# Patient Record
Sex: Female | Born: 1980 | Race: Black or African American | Hispanic: No | Marital: Married | Smoking: Never smoker
Health system: Southern US, Community
[De-identification: ages and names within clinical notes are randomized; demographics above are authoritative.]

## PROBLEM LIST (undated history)

## (undated) DIAGNOSIS — M7989 Other specified soft tissue disorders: Secondary | ICD-10-CM

## (undated) DIAGNOSIS — Z86718 Personal history of other venous thrombosis and embolism: Secondary | ICD-10-CM

## (undated) DIAGNOSIS — I10 Essential (primary) hypertension: Secondary | ICD-10-CM

## (undated) DIAGNOSIS — R7303 Prediabetes: Secondary | ICD-10-CM

## (undated) DIAGNOSIS — E559 Vitamin D deficiency, unspecified: Secondary | ICD-10-CM

## (undated) DIAGNOSIS — M2550 Pain in unspecified joint: Secondary | ICD-10-CM

## (undated) HISTORY — DX: Other specified soft tissue disorders: M79.89

## (undated) HISTORY — DX: Prediabetes: R73.03

## (undated) HISTORY — DX: Vitamin D deficiency, unspecified: E55.9

## (undated) HISTORY — DX: Pain in unspecified joint: M25.50

## (undated) HISTORY — PX: DILATION AND CURETTAGE OF UTERUS: SHX78

## (undated) HISTORY — DX: Personal history of other venous thrombosis and embolism: Z86.718

---

## 2000-11-30 ENCOUNTER — Inpatient Hospital Stay (HOSPITAL_COMMUNITY): Admission: AD | Admit: 2000-11-30 | Discharge: 2000-11-30 | Payer: Self-pay | Admitting: *Deleted

## 2001-07-25 ENCOUNTER — Inpatient Hospital Stay (HOSPITAL_COMMUNITY): Admission: AD | Admit: 2001-07-25 | Discharge: 2001-07-25 | Payer: Self-pay | Admitting: *Deleted

## 2002-02-13 ENCOUNTER — Inpatient Hospital Stay (HOSPITAL_COMMUNITY): Admission: AD | Admit: 2002-02-13 | Discharge: 2002-02-13 | Payer: Self-pay | Admitting: *Deleted

## 2009-11-30 ENCOUNTER — Ambulatory Visit: Payer: Self-pay | Admitting: Radiology

## 2009-11-30 ENCOUNTER — Emergency Department (HOSPITAL_BASED_OUTPATIENT_CLINIC_OR_DEPARTMENT_OTHER): Admission: EM | Admit: 2009-11-30 | Discharge: 2009-11-30 | Payer: Self-pay | Admitting: Emergency Medicine

## 2010-11-25 ENCOUNTER — Other Ambulatory Visit (HOSPITAL_COMMUNITY)
Admission: RE | Admit: 2010-11-25 | Discharge: 2010-11-25 | Disposition: A | Discharge status: Home / Self Care | Payer: Managed Care, Other (non HMO) | Source: Ambulatory Visit | Attending: Obstetrics and Gynecology | Admitting: Obstetrics and Gynecology

## 2010-11-25 DIAGNOSIS — Z1159 Encounter for screening for other viral diseases: Secondary | ICD-10-CM | POA: Insufficient documentation

## 2010-11-25 DIAGNOSIS — Z113 Encounter for screening for infections with a predominantly sexual mode of transmission: Secondary | ICD-10-CM | POA: Insufficient documentation

## 2010-11-25 DIAGNOSIS — Z124 Encounter for screening for malignant neoplasm of cervix: Secondary | ICD-10-CM | POA: Insufficient documentation

## 2011-01-08 ENCOUNTER — Encounter: Payer: Self-pay | Admitting: *Deleted

## 2011-01-08 ENCOUNTER — Emergency Department (HOSPITAL_BASED_OUTPATIENT_CLINIC_OR_DEPARTMENT_OTHER)
Admission: EM | Admit: 2011-01-08 | Discharge: 2011-01-08 | Disposition: A | Discharge status: Home / Self Care | Payer: Managed Care, Other (non HMO) | Attending: Emergency Medicine | Admitting: Emergency Medicine

## 2011-01-08 DIAGNOSIS — S0511XA Contusion of eyeball and orbital tissues, right eye, initial encounter: Secondary | ICD-10-CM

## 2011-01-08 DIAGNOSIS — H1130 Conjunctival hemorrhage, unspecified eye: Secondary | ICD-10-CM | POA: Insufficient documentation

## 2011-01-08 DIAGNOSIS — I10 Essential (primary) hypertension: Secondary | ICD-10-CM | POA: Insufficient documentation

## 2011-01-08 DIAGNOSIS — S0510XA Contusion of eyeball and orbital tissues, unspecified eye, initial encounter: Secondary | ICD-10-CM | POA: Insufficient documentation

## 2011-01-08 HISTORY — DX: Essential (primary) hypertension: I10

## 2011-01-08 MED ORDER — HYDROCODONE-ACETAMINOPHEN 5-325 MG PO TABS: 2.0000 | ORAL_TABLET | ORAL | Status: AC | PRN

## 2011-01-08 MED ORDER — TETRACAINE HCL 0.5 % OP SOLN
OPHTHALMIC | Status: AC
  Filled 2011-01-08: qty 2

## 2011-01-08 MED ORDER — IBUPROFEN 800 MG PO TABS: 800.0000 mg | ORAL_TABLET | Freq: Three times a day (TID) | ORAL | Status: AC | PRN

## 2011-01-08 MED ORDER — FLUORESCEIN SODIUM 1 MG OP STRP
ORAL_STRIP | OPHTHALMIC | Status: AC
  Filled 2011-01-08: qty 1

## 2011-01-08 MED ORDER — TETRACAINE HCL 0.5 % OP SOLN: 2.0000 [drp] | Freq: Once | OPHTHALMIC | Status: DC

## 2011-01-08 MED ORDER — FLUORESCEIN SODIUM 1 MG OP STRP: 1.0000 | ORAL_STRIP | Freq: Once | OPHTHALMIC | Status: DC
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
## 2012-05-13 ENCOUNTER — Encounter (HOSPITAL_BASED_OUTPATIENT_CLINIC_OR_DEPARTMENT_OTHER): Payer: Self-pay | Admitting: *Deleted

## 2012-05-13 ENCOUNTER — Emergency Department (HOSPITAL_BASED_OUTPATIENT_CLINIC_OR_DEPARTMENT_OTHER)
Admission: EM | Admit: 2012-05-13 | Discharge: 2012-05-13 | Disposition: A | Discharge status: Home / Self Care | Payer: Managed Care, Other (non HMO) | Attending: Emergency Medicine | Admitting: Emergency Medicine

## 2012-05-13 DIAGNOSIS — Z79899 Other long term (current) drug therapy: Secondary | ICD-10-CM | POA: Insufficient documentation

## 2012-05-13 DIAGNOSIS — R51 Headache: Secondary | ICD-10-CM | POA: Insufficient documentation

## 2012-05-13 DIAGNOSIS — R0682 Tachypnea, not elsewhere classified: Secondary | ICD-10-CM | POA: Insufficient documentation

## 2012-05-13 DIAGNOSIS — R0989 Other specified symptoms and signs involving the circulatory and respiratory systems: Secondary | ICD-10-CM | POA: Insufficient documentation

## 2012-05-13 DIAGNOSIS — J3489 Other specified disorders of nose and nasal sinuses: Secondary | ICD-10-CM | POA: Insufficient documentation

## 2012-05-13 DIAGNOSIS — I10 Essential (primary) hypertension: Secondary | ICD-10-CM | POA: Insufficient documentation

## 2012-05-13 DIAGNOSIS — E669 Obesity, unspecified: Secondary | ICD-10-CM | POA: Insufficient documentation

## 2012-05-13 DIAGNOSIS — J069 Acute upper respiratory infection, unspecified: Secondary | ICD-10-CM

## 2012-05-13 DIAGNOSIS — R05 Cough: Secondary | ICD-10-CM | POA: Insufficient documentation

## 2012-05-13 DIAGNOSIS — R062 Wheezing: Secondary | ICD-10-CM | POA: Insufficient documentation

## 2012-05-13 DIAGNOSIS — J9801 Acute bronchospasm: Secondary | ICD-10-CM

## 2012-05-13 DIAGNOSIS — R6883 Chills (without fever): Secondary | ICD-10-CM | POA: Insufficient documentation

## 2012-05-13 DIAGNOSIS — R059 Cough, unspecified: Secondary | ICD-10-CM | POA: Insufficient documentation

## 2012-05-13 DIAGNOSIS — R0789 Other chest pain: Secondary | ICD-10-CM | POA: Insufficient documentation

## 2012-05-13 MED ORDER — ALBUTEROL SULFATE (5 MG/ML) 0.5% IN NEBU
5.0000 mg | INHALATION_SOLUTION | Freq: Once | RESPIRATORY_TRACT | Status: AC
  Administered 2012-05-13: 5 mg via RESPIRATORY_TRACT

## 2012-05-13 MED ORDER — ALBUTEROL SULFATE (5 MG/ML) 0.5% IN NEBU
INHALATION_SOLUTION | RESPIRATORY_TRACT | Status: AC
  Administered 2012-05-13: 5 mg via RESPIRATORY_TRACT
  Filled 2012-05-13: qty 1

## 2012-05-13 MED ORDER — ALBUTEROL SULFATE HFA 108 (90 BASE) MCG/ACT IN AERS
2.0000 | INHALATION_SPRAY | Freq: Once | RESPIRATORY_TRACT | Status: AC
  Administered 2012-05-13: 2 via RESPIRATORY_TRACT
  Filled 2012-05-13: qty 6.7

## 2012-05-13 MED ORDER — ALBUTEROL SULFATE (5 MG/ML) 0.5% IN NEBU
5.0000 mg | INHALATION_SOLUTION | Freq: Once | RESPIRATORY_TRACT | Status: AC
  Administered 2012-05-13: 5 mg via RESPIRATORY_TRACT
  Filled 2012-05-13: qty 1

## 2012-05-13 MED ORDER — PREDNISONE 50 MG PO TABS
60.0000 mg | ORAL_TABLET | Freq: Once | ORAL | Status: AC
  Administered 2012-05-13: 60 mg via ORAL
  Filled 2012-05-13: qty 1

## 2012-05-13 NOTE — ED Notes (Signed)
Headache and chest congestion x 3 days.

## 2012-05-13 NOTE — ED Provider Notes (Signed)
History     CSN: [id number]  Arrival date & time 05/13/12  1413   First MD Initiated Contact with Patient 05/13/12 1424      Chief Complaint  Patient presents with  . URI    (Consider location/radiation/quality/duration/timing/severity/associated sxs/prior treatment) HPI Comments: 32 year old obese female presents the emergency department complaining of cough and chest congestion x3 days. Patient states the cough is dry, however feels as if she has mucus in her chest. Admits to associated headache, sinus pressure and chills. Denies fever, nausea or vomiting. She and her husband were out working in the yard a few days back and believes the pollen making her allergies act up. She tried taking over-the-counter Tylenol cold at night which gave her relief from the cough. Her daily allergy pill is not providing any relief. Denies chest pain, just tightness. No SOB.  Patient is a 32 y.o. female presenting with URI. The history is provided by the patient.  URI Presenting symptoms: congestion and cough   Presenting symptoms: no fever and no sore throat   Associated symptoms: headaches and wheezing   Associated symptoms: no neck pain     Past Medical History  Diagnosis Date  . Hypertension     History reviewed. No pertinent past surgical history.  No family history on file.  History  Substance Use Topics  . Smoking status: Never Smoker   . Smokeless tobacco: Not on file  . Alcohol Use: No    OB History   Grav Para Term Preterm Abortions TAB SAB Ect Mult Living                  Review of Systems  Constitutional: Positive for chills. Negative for fever.  HENT: Positive for congestion and sinus pressure. Negative for sore throat, neck pain and neck stiffness.   Respiratory: Positive for cough, chest tightness and wheezing. Negative for shortness of breath.   Cardiovascular: Negative for chest pain.  Gastrointestinal: Negative for nausea and vomiting.  Neurological: Positive  for headaches. Negative for dizziness and light-headedness.  All other systems reviewed and are negative.    Allergies  Penicillins  Home Medications   Current Outpatient Rx  Name  Route  Sig  Dispense  Refill  . chlorthalidone (HYGROTON) 25 MG tablet   Oral   Take 25 mg by mouth daily.           Marland Kitchen losartan (COZAAR) 50 MG tablet   Oral   Take 50 mg by mouth daily.             BP 154/84  Pulse 82  Temp(Src) 98.4 F (36.9 C) (Oral)  Resp 22  Wt 330 lb (149.687 kg)  BMI 46.05 kg/m2  SpO2 99%  Physical Exam  Nursing note and vitals reviewed. Constitutional: She is oriented to person, place, and time. She appears well-developed. No distress.  Obese  HENT:  Head: Normocephalic and atraumatic.  Nose: No mucosal edema or rhinorrhea. Right sinus exhibits maxillary sinus tenderness. Left sinus exhibits maxillary sinus tenderness.  Mouth/Throat: Uvula is midline and mucous membranes are normal. Posterior oropharyngeal erythema present. No oropharyngeal exudate or posterior oropharyngeal edema.  Eyes: Conjunctivae are normal.  Neck: Normal range of motion. Neck supple.  Cardiovascular: Normal rate, regular rhythm, normal heart sounds and intact distal pulses.   Pulmonary/Chest: No accessory muscle usage. Tachypnea (mild) noted. No respiratory distress. She has no decreased breath sounds. She has wheezes (scattered expiratory). She has no rhonchi. She has no rales.  Musculoskeletal:  Normal range of motion. She exhibits no edema.  Lymphadenopathy:    She has no cervical adenopathy.  Neurological: She is alert and oriented to person, place, and time.  Skin: Skin is warm and dry.  Psychiatric: She has a normal mood and affect. Her behavior is normal.    ED Course  Procedures (including critical care time)  Labs Reviewed - No data to display No results found.   1. Bronchospasm   2. URI (upper respiratory infection)       MDM  32 y/o female with wheezing/cough. Will  give albuterol breathing treatment and re-assess. Vitals stable. Afebrile. She is in NAD. 3:03 PM Still with expiratory wheezing after first albuterol treatment. Will give 60 mg PO prednisone and another breathing treatment. 3:41 PM Marked clinical improvement noted with second albuterol treatment and PO prednisone. She is feeling much better. Will discharge with albuterol inhaler. Conservative measures discussed. Return precautions discussed. She will f/u with her PCP. Patient states understanding of plan and is agreeable.   Trevor Mace, PA-C 05/13/12 [phone]

## 2012-05-15 NOTE — ED Provider Notes (Signed)
Medical screening examination/treatment/procedure(s) were performed by non-physician practitioner and as supervising physician I was immediately available for consultation/collaboration.  Arlyn Bumpus T Zanyah Lentsch, MD 05/15/12 0829 

## 2013-11-05 ENCOUNTER — Emergency Department (HOSPITAL_BASED_OUTPATIENT_CLINIC_OR_DEPARTMENT_OTHER)
Admission: EM | Admit: 2013-11-05 | Discharge: 2013-11-05 | Disposition: A | Discharge status: Home / Self Care | Payer: Managed Care, Other (non HMO) | Attending: Emergency Medicine | Admitting: Emergency Medicine

## 2013-11-05 ENCOUNTER — Encounter (HOSPITAL_BASED_OUTPATIENT_CLINIC_OR_DEPARTMENT_OTHER): Payer: Self-pay | Admitting: Emergency Medicine

## 2013-11-05 DIAGNOSIS — R0602 Shortness of breath: Secondary | ICD-10-CM | POA: Diagnosis present

## 2013-11-05 DIAGNOSIS — Z880 Allergy status to penicillin: Secondary | ICD-10-CM | POA: Insufficient documentation

## 2013-11-05 DIAGNOSIS — J9801 Acute bronchospasm: Secondary | ICD-10-CM | POA: Diagnosis not present

## 2013-11-05 DIAGNOSIS — J069 Acute upper respiratory infection, unspecified: Secondary | ICD-10-CM | POA: Diagnosis not present

## 2013-11-05 DIAGNOSIS — Z79899 Other long term (current) drug therapy: Secondary | ICD-10-CM | POA: Insufficient documentation

## 2013-11-05 DIAGNOSIS — I10 Essential (primary) hypertension: Secondary | ICD-10-CM | POA: Diagnosis not present

## 2013-11-05 MED ORDER — ALBUTEROL SULFATE HFA 108 (90 BASE) MCG/ACT IN AERS: 1.0000 | INHALATION_SPRAY | Freq: Four times a day (QID) | RESPIRATORY_TRACT | Status: DC | PRN

## 2013-11-05 MED ORDER — AZITHROMYCIN 250 MG PO TABS: 250.0000 mg | ORAL_TABLET | Freq: Every day | ORAL | Status: DC

## 2013-11-05 NOTE — ED Notes (Signed)
Pt c/o URi symptoms x 1 week  

## 2013-11-05 NOTE — ED Provider Notes (Signed)
CSN: [id number]     Arrival date & time 11/05/13  1311 History   First MD Initiated Contact with Patient 11/05/13 1321     Chief Complaint  Patient presents with  . Shortness of Breath     (Consider location/radiation/quality/duration/timing/severity/associated sxs/prior Treatment) Patient is a 33 y.o. female presenting with shortness of breath. The history is provided by the patient. No language interpreter was used.  Shortness of Breath Severity:  Moderate Onset quality:  Gradual Duration:  1 week Timing:  Constant Associated symptoms: cough and sore throat   Associated symptoms: no abdominal pain, no fever, no headaches, no rash, no vomiting and no wheezing   Associated symptoms comment:  Symptoms started one week ago as sinus and nasal congestion with sore throat and have now moved into the chest with a persistent, sometimes productive cough. No known fever. No N, V. She has a history of similar symptoms yearly.   Past Medical History  Diagnosis Date  . Hypertension    History reviewed. No pertinent past surgical history. History reviewed. No pertinent family history. History  Substance Use Topics  . Smoking status: Never Smoker   . Smokeless tobacco: Not on file  . Alcohol Use: No   OB History   Grav Para Term Preterm Abortions TAB SAB Ect Mult Living                 Review of Systems  Constitutional: Negative for fever and appetite change.  HENT: Positive for congestion, sinus pressure and sore throat. Negative for trouble swallowing.   Eyes: Negative for discharge and redness.  Respiratory: Positive for cough. Negative for shortness of breath and wheezing.   Gastrointestinal: Negative for nausea, vomiting and abdominal pain.  Musculoskeletal: Negative for myalgias.  Skin: Negative for rash.  Neurological: Negative for weakness and headaches.      Allergies  Penicillins  Home Medications   Prior to Admission medications   Medication Sig Start Date End  Date Taking? Authorizing Provider  chlorthalidone (HYGROTON) 25 MG tablet Take 25 mg by mouth daily.      Historical Provider, MD  losartan (COZAAR) 50 MG tablet Take 50 mg by mouth daily.      Historical Provider, MD   BP 168/97  Pulse 88  Temp(Src) 98.2 F (36.8 C) (Oral)  Resp 20  Ht 5' 10.5" (1.791 m)  Wt 380 lb (172.367 kg)  BMI 53.74 kg/m2  SpO2 97%  LMP 08/06/2013 Physical Exam  Constitutional: She is oriented to person, place, and time. She appears well-developed and well-nourished. No distress.  HENT:  Head: Normocephalic.  Right Ear: External ear normal.  Left Ear: External ear normal.  Nose: Mucosal edema present. Right sinus exhibits no frontal sinus tenderness. Left sinus exhibits no frontal sinus tenderness.  Mouth/Throat: Oropharynx is clear and moist.  Eyes: Conjunctivae are normal.  Neck: Normal range of motion. Neck supple.  Cardiovascular: Normal rate and normal heart sounds.   No murmur heard. Pulmonary/Chest: Effort normal and breath sounds normal. She has no wheezes. She has no rales. She exhibits no tenderness.  Abdominal: Soft. Bowel sounds are normal. She exhibits no distension. There is no tenderness.  Musculoskeletal: Normal range of motion.  Lymphadenopathy:    She has no cervical adenopathy.  Neurological: She is alert and oriented to person, place, and time.  Skin: Skin is warm and dry. No rash noted.  Psychiatric: She has a normal mood and affect.    ED Course  Procedures (including critical care  time) Labs Review Labs Reviewed - No data to display  Imaging Review No results found.   EKG Interpretation None      MDM   Final diagnoses:  None    1. URI 2. Bronchospasm  Given duration of symptoms, now worsening, will treat with abx, inhaler, OTC supportive care and PCP follow up prn.    Arnoldo Hooker, PA-C 11/05/13 1331

## 2013-11-05 NOTE — Discharge Instructions (Signed)
Bronchospasm °A bronchospasm is a spasm or tightening of the airways going into the lungs. During a bronchospasm breathing becomes more difficult because the airways get smaller. When this happens there can be coughing, a whistling sound when breathing (wheezing), and difficulty breathing. Bronchospasm is often associated with asthma, but not all patients who experience a bronchospasm have asthma. °CAUSES  °A bronchospasm is caused by inflammation or irritation of the airways. The inflammation or irritation may be triggered by:  °· Allergies (such as to animals, pollen, food, or mold). Allergens that cause bronchospasm may cause wheezing immediately after exposure or many hours later.   °· Infection. Viral infections are believed to be the most common cause of bronchospasm.   °· Exercise.   °· Irritants (such as pollution, cigarette smoke, strong odors, aerosol sprays, and paint fumes).   °· Weather changes. Winds increase molds and pollens in the air. Rain refreshes the air by washing irritants out. Cold air may cause inflammation.   °· Stress and emotional upset.   °SIGNS AND SYMPTOMS  °· Wheezing.   °· Excessive nighttime coughing.   °· Frequent or severe coughing with a simple cold.   °· Chest tightness.   °· Shortness of breath.   °DIAGNOSIS  °Bronchospasm is usually diagnosed through a history and physical exam. Tests, such as chest X-rays, are sometimes done to look for other conditions. °TREATMENT  °· Inhaled medicines can be given to open up your airways and help you breathe. The medicines can be given using either an inhaler or a nebulizer machine. °· Corticosteroid medicines may be given for severe bronchospasm, usually when it is associated with asthma. °HOME CARE INSTRUCTIONS  °· Always have a plan prepared for seeking medical care. Know when to call your health care provider and local emergency services (911 in the [place] Know where you can access local emergency care. °· Only take medicines as  directed by your health care provider. °· If you were prescribed an inhaler or nebulizer machine, ask your health care provider to explain how to use it correctly. Always use a spacer with your inhaler if you were given one. °· It is necessary to remain calm during an attack. Try to relax and breathe more slowly.  °· Control your home environment in the following ways:   °¨ Change your heating and air conditioning filter at least once a month.   °¨ Limit your use of fireplaces and wood stoves. °¨ Do not smoke and do not allow smoking in your home.   °¨ Avoid exposure to perfumes and fragrances.   °¨ Get rid of pests (such as roaches and mice) and their droppings.   °¨ Throw away plants if you see mold on them.   °¨ Keep your house clean and dust free.   °¨ Replace carpet with wood, tile, or vinyl flooring. Carpet can trap dander and dust.   °¨ Use allergy-proof pillows, mattress covers, and box spring covers.   °¨ Wash bed sheets and blankets every week in hot water and dry them in a dryer.   °¨ Use blankets that are made of polyester or cotton.   °¨ Wash hands frequently. °SEEK MEDICAL CARE IF:  °· You have muscle aches.   °· You have chest pain.   °· The sputum changes from clear or white to yellow, green, gray, or bloody.   °· The sputum you cough up gets thicker.   °· There are problems that may be related to the medicine you are given, such as a rash, itching, swelling, or trouble breathing.   °SEEK IMMEDIATE MEDICAL CARE IF:  °· You have worsening wheezing and coughing even   after taking your prescribed medicines.   You have increased difficulty breathing.   You develop severe chest pain. MAKE SURE YOU:   Understand these instructions.  Will watch your condition.  Will get help right away if you are not doing well or get worse. Document Released: 01/19/2003 Document Revised: 01/21/2013 Document Reviewed: 07/08/2012 [facility] Patient Information 2015 [place], [place]. This information is not  intended to replace advice given to you by your health care provider. Make sure you discuss any questions you have with your health care provider. Upper Respiratory Infection, Adult An upper respiratory infection (URI) is also known as the common cold. It is often caused by a type of germ (virus). Colds are easily spread (contagious). You can pass it to others by kissing, coughing, sneezing, or drinking out of the same glass. Usually, you get better in 1 or 2 weeks.  HOME CARE   Only take medicine as told by your doctor.  Use a warm mist humidifier or breathe in steam from a hot shower.  Drink enough water and fluids to keep your pee (urine) clear or pale yellow.  Get plenty of rest.  Return to work when your temperature is back to normal or as told by your doctor. You may use a face mask and wash your hands to stop your cold from spreading. GET HELP RIGHT AWAY IF:   After the first few days, you feel you are getting worse.  You have questions about your medicine.  You have chills, shortness of breath, or brown or red spit (mucus).  You have yellow or brown snot (nasal discharge) or pain in the face, especially when you bend forward.  You have a fever, puffy (swollen) neck, pain when you swallow, or white spots in the back of your throat.  You have a bad headache, ear pain, sinus pain, or chest pain.  You have a high-pitched whistling sound when you breathe in and out (wheezing).  You have a lasting cough or cough up blood.  You have sore muscles or a stiff neck. MAKE SURE YOU:   Understand these instructions.  Will watch your condition.  Will get help right away if you are not doing well or get worse. Document Released: 07/05/2007 Document Revised: 04/10/2011 Document Reviewed: 04/23/2013 [facility] Patient Information 2015 [place], [place]. This information is not intended to replace advice given to you by your health care provider. Make sure you discuss any questions you have  with your health care provider.

## 2013-11-05 NOTE — ED Provider Notes (Signed)
Medical screening examination/treatment/procedure(s) were performed by non-physician practitioner and as supervising physician I was immediately available for consultation/collaboration.    Jerrald Doverspike, MLinwood Dibbles 11/05/13 [phone]

## 2014-04-23 ENCOUNTER — Encounter (HOSPITAL_BASED_OUTPATIENT_CLINIC_OR_DEPARTMENT_OTHER): Payer: Self-pay | Admitting: *Deleted

## 2014-04-23 ENCOUNTER — Emergency Department (HOSPITAL_BASED_OUTPATIENT_CLINIC_OR_DEPARTMENT_OTHER)
Admission: EM | Admit: 2014-04-23 | Discharge: 2014-04-23 | Discharge status: Against Medical Advice | Payer: Managed Care, Other (non HMO) | Attending: Emergency Medicine | Admitting: Emergency Medicine

## 2014-04-23 DIAGNOSIS — R42 Dizziness and giddiness: Secondary | ICD-10-CM | POA: Insufficient documentation

## 2014-04-23 DIAGNOSIS — R110 Nausea: Secondary | ICD-10-CM | POA: Insufficient documentation

## 2014-04-23 DIAGNOSIS — I10 Essential (primary) hypertension: Secondary | ICD-10-CM | POA: Insufficient documentation

## 2014-04-23 NOTE — ED Notes (Addendum)
Dizziness. States she has had cold sweats and nausea sudden onset for the past hour. States she started taking Phentermine 2 weeks ago and thinks that is why she is having these symptoms.

## 2014-04-23 NOTE — ED Notes (Signed)
Patient left.

## 2014-10-31 ENCOUNTER — Emergency Department (HOSPITAL_BASED_OUTPATIENT_CLINIC_OR_DEPARTMENT_OTHER)
Admission: EM | Admit: 2014-10-31 | Discharge: 2014-10-31 | Disposition: A | Discharge status: Home / Self Care | Payer: Managed Care, Other (non HMO) | Attending: Emergency Medicine | Admitting: Emergency Medicine

## 2014-10-31 ENCOUNTER — Encounter (HOSPITAL_BASED_OUTPATIENT_CLINIC_OR_DEPARTMENT_OTHER): Payer: Self-pay | Admitting: Emergency Medicine

## 2014-10-31 DIAGNOSIS — I10 Essential (primary) hypertension: Secondary | ICD-10-CM | POA: Insufficient documentation

## 2014-10-31 DIAGNOSIS — Z880 Allergy status to penicillin: Secondary | ICD-10-CM | POA: Insufficient documentation

## 2014-10-31 DIAGNOSIS — K122 Cellulitis and abscess of mouth: Secondary | ICD-10-CM

## 2014-10-31 DIAGNOSIS — Z79899 Other long term (current) drug therapy: Secondary | ICD-10-CM | POA: Insufficient documentation

## 2014-10-31 MED ORDER — DEXAMETHASONE SODIUM PHOSPHATE 10 MG/ML IJ SOLN
10.0000 mg | Freq: Once | INTRAMUSCULAR | Status: DC
  Filled 2014-10-31: qty 1

## 2014-10-31 MED ORDER — DEXAMETHASONE SODIUM PHOSPHATE 10 MG/ML IJ SOLN: 10.0000 mg | Freq: Once | INTRAMUSCULAR | Status: AC

## 2014-10-31 MED ORDER — DIPHENHYDRAMINE HCL 25 MG PO TABS: 25.0000 mg | ORAL_TABLET | Freq: Four times a day (QID) | ORAL | Status: DC

## 2014-10-31 MED ORDER — DIPHENHYDRAMINE HCL 12.5 MG/5ML PO ELIX
25.0000 mg | ORAL_SOLUTION | Freq: Once | ORAL | Status: AC
  Administered 2014-10-31: 25 mg via ORAL
  Filled 2014-10-31: qty 10

## 2014-10-31 MED ORDER — DEXAMETHASONE SODIUM PHOSPHATE 10 MG/ML IJ SOLN
10.0000 mg | Freq: Once | INTRAMUSCULAR | Status: AC
  Administered 2014-10-31: 10 mg via INTRAMUSCULAR

## 2014-10-31 NOTE — ED Notes (Signed)
Uvula appears swollen, no redness noted in throat noted at this time,

## 2014-10-31 NOTE — ED Provider Notes (Signed)
CSN: [id number]     Arrival date & time 10/31/14  1311 History   First MD Initiated Contact with Patient 10/31/14 1435     Chief Complaint  Patient presents with  . Sore Throat     (Consider location/radiation/quality/duration/timing/severity/associated sxs/prior Treatment) HPI Comments: Patient is 34 year old female with a history of hypertension and most likely obstructive sleep apnea as she states she does snore loudly when she sleeps who woke up this morning with leg cramps and took a spoonful of mustard. She then went back to sleep and when she will cut that 11:30 she had a sensation of something being in her throat. She was unable to take her blood pressure medication because she Gagging but has been able to drink liquids and tolerate her secretions. She denies any shortness of breath, rash or itching. She has had mustard before and never had a reaction to it. She says when she went back to sleep she did lay on her back which she normally sleeps on her side. No prior allergic reactions  Patient is a 34 y.o. female presenting with pharyngitis. The history is provided by the patient.  Sore Throat    Past Medical History  Diagnosis Date  . Hypertension    History reviewed. No pertinent past surgical history. History reviewed. No pertinent family history. Social History  Substance Use Topics  . Smoking status: Never Smoker   . Smokeless tobacco: None  . Alcohol Use: No   OB History    No data available     Review of Systems  All other systems reviewed and are negative.     Allergies  Penicillins  Home Medications   Prior to Admission medications   Medication Sig Start Date End Date Taking? Authorizing Provider  losartan (COZAAR) 50 MG tablet Take 50 mg by mouth daily.     Yes Historical Provider, MD  VALSARTAN PO Take by mouth.   Yes Historical Provider, MD  diphenhydrAMINE (BENADRYL) 25 MG tablet Take 1 tablet (25 mg total) by mouth every 6 (six) hours. 10/31/14    Gwyneth Sprout, MD   BP 149/84 mmHg  Pulse 72  Temp(Src) 98 F (36.7 C) (Oral)  Resp 18  SpO2 100% Physical Exam  Constitutional: She is oriented to person, place, and time. She appears well-developed and well-nourished. No distress.  HENT:  Head: Normocephalic and atraumatic.  Mouth/Throat: Oropharynx is clear and moist and mucous membranes are normal.    Eyes: Conjunctivae and EOM are normal. Pupils are equal, round, and reactive to light.  Neck: Normal range of motion. Neck supple.  Cardiovascular: Normal rate, regular rhythm and intact distal pulses.   No murmur heard. Pulmonary/Chest: Effort normal and breath sounds normal. No respiratory distress. She has no wheezes. She has no rales.  Musculoskeletal: Normal range of motion. She exhibits no edema or tenderness.  Neurological: She is alert and oriented to person, place, and time.  Skin: Skin is warm and dry. No rash noted. No erythema.  Psychiatric: She has a normal mood and affect. Her behavior is normal.  Nursing note and vitals reviewed.   ED Course  Procedures (including critical care time) Labs Review Labs Reviewed - No data to display  Imaging Review No results found. I have personally reviewed and evaluated these images and lab results as part of my medical decision-making.   EKG Interpretation None      MDM   Final diagnoses:  Uvulitis    Patient with uvulitis today which may be  due to a reaction to mustard versus an irritation from laying on her back and snoring. She woke up from sleep this morning around 11:30 with a globus sensation in her throat and swollen uvula. Here her uvula is still swollen but appears to be improving based on the picture she took at home. Patient is tolerating her own secretions but states was having difficulty swallowing her blood pressure pills. She is able to swallow liquids. Patient given Decadron and Benadryl. She lives close to the hospital and will return if symptoms  worsen.    Gwyneth Sprout, MD 10/31/14 [phone]

## 2014-10-31 NOTE — ED Notes (Signed)
Tracheal sounds clear, no dysphagia or dysphonia

## 2014-10-31 NOTE — ED Notes (Addendum)
Presents with sore throat, onset approx 11am, felt full, incident occurred after having some mustard

## 2014-10-31 NOTE — ED Notes (Signed)
Pt states she was having leg cramps this am, awoke and ate spoon full of mustard, went back to sleep and awoke around 1130 with sore throat

## 2015-08-24 DIAGNOSIS — Z0000 Encounter for general adult medical examination without abnormal findings: Secondary | ICD-10-CM | POA: Diagnosis not present

## 2015-08-24 DIAGNOSIS — H538 Other visual disturbances: Secondary | ICD-10-CM | POA: Diagnosis not present

## 2015-08-24 DIAGNOSIS — Z136 Encounter for screening for cardiovascular disorders: Secondary | ICD-10-CM | POA: Diagnosis not present

## 2015-08-24 DIAGNOSIS — R51 Headache: Secondary | ICD-10-CM | POA: Diagnosis not present

## 2015-08-24 DIAGNOSIS — R7303 Prediabetes: Secondary | ICD-10-CM | POA: Diagnosis not present

## 2015-08-24 DIAGNOSIS — Z01118 Encounter for examination of ears and hearing with other abnormal findings: Secondary | ICD-10-CM | POA: Diagnosis not present

## 2015-08-24 DIAGNOSIS — Z1383 Encounter for screening for respiratory disorder NEC: Secondary | ICD-10-CM | POA: Diagnosis not present

## 2015-08-24 DIAGNOSIS — I10 Essential (primary) hypertension: Secondary | ICD-10-CM | POA: Diagnosis not present

## 2015-08-24 DIAGNOSIS — I119 Hypertensive heart disease without heart failure: Secondary | ICD-10-CM | POA: Diagnosis not present

## 2015-08-24 DIAGNOSIS — Z131 Encounter for screening for diabetes mellitus: Secondary | ICD-10-CM | POA: Diagnosis not present

## 2015-09-10 DIAGNOSIS — I10 Essential (primary) hypertension: Secondary | ICD-10-CM | POA: Diagnosis not present

## 2015-09-10 DIAGNOSIS — I119 Hypertensive heart disease without heart failure: Secondary | ICD-10-CM | POA: Diagnosis not present

## 2015-09-10 DIAGNOSIS — K219 Gastro-esophageal reflux disease without esophagitis: Secondary | ICD-10-CM | POA: Diagnosis not present

## 2015-09-10 DIAGNOSIS — E559 Vitamin D deficiency, unspecified: Secondary | ICD-10-CM | POA: Diagnosis not present

## 2015-10-07 DIAGNOSIS — I119 Hypertensive heart disease without heart failure: Secondary | ICD-10-CM | POA: Diagnosis not present

## 2015-10-14 DIAGNOSIS — F4321 Adjustment disorder with depressed mood: Secondary | ICD-10-CM | POA: Diagnosis not present

## 2015-10-14 DIAGNOSIS — F322 Major depressive disorder, single episode, severe without psychotic features: Secondary | ICD-10-CM | POA: Diagnosis not present

## 2015-10-28 DIAGNOSIS — J069 Acute upper respiratory infection, unspecified: Secondary | ICD-10-CM | POA: Diagnosis not present

## 2015-10-28 DIAGNOSIS — I119 Hypertensive heart disease without heart failure: Secondary | ICD-10-CM | POA: Diagnosis not present

## 2015-10-28 DIAGNOSIS — K219 Gastro-esophageal reflux disease without esophagitis: Secondary | ICD-10-CM | POA: Diagnosis not present

## 2015-10-28 DIAGNOSIS — I10 Essential (primary) hypertension: Secondary | ICD-10-CM | POA: Diagnosis not present

## 2015-11-02 DIAGNOSIS — F4323 Adjustment disorder with mixed anxiety and depressed mood: Secondary | ICD-10-CM | POA: Diagnosis not present

## 2015-11-15 DIAGNOSIS — F4323 Adjustment disorder with mixed anxiety and depressed mood: Secondary | ICD-10-CM | POA: Diagnosis not present

## 2015-11-17 DIAGNOSIS — F410 Panic disorder [episodic paroxysmal anxiety] without agoraphobia: Secondary | ICD-10-CM | POA: Diagnosis not present

## 2015-11-17 DIAGNOSIS — F321 Major depressive disorder, single episode, moderate: Secondary | ICD-10-CM | POA: Diagnosis not present

## 2015-11-17 DIAGNOSIS — F430 Acute stress reaction: Secondary | ICD-10-CM | POA: Diagnosis not present

## 2016-01-06 DIAGNOSIS — F4311 Post-traumatic stress disorder, acute: Secondary | ICD-10-CM | POA: Diagnosis not present

## 2016-01-06 DIAGNOSIS — F410 Panic disorder [episodic paroxysmal anxiety] without agoraphobia: Secondary | ICD-10-CM | POA: Diagnosis not present

## 2016-02-11 DIAGNOSIS — K219 Gastro-esophageal reflux disease without esophagitis: Secondary | ICD-10-CM | POA: Diagnosis not present

## 2016-02-11 DIAGNOSIS — Z0000 Encounter for general adult medical examination without abnormal findings: Secondary | ICD-10-CM | POA: Diagnosis not present

## 2016-02-11 DIAGNOSIS — E559 Vitamin D deficiency, unspecified: Secondary | ICD-10-CM | POA: Diagnosis not present

## 2016-02-11 DIAGNOSIS — I10 Essential (primary) hypertension: Secondary | ICD-10-CM | POA: Diagnosis not present

## 2016-02-11 DIAGNOSIS — Z131 Encounter for screening for diabetes mellitus: Secondary | ICD-10-CM | POA: Diagnosis not present

## 2016-02-11 DIAGNOSIS — Z136 Encounter for screening for cardiovascular disorders: Secondary | ICD-10-CM | POA: Diagnosis not present

## 2016-02-11 DIAGNOSIS — R7303 Prediabetes: Secondary | ICD-10-CM | POA: Diagnosis not present

## 2016-02-11 DIAGNOSIS — I119 Hypertensive heart disease without heart failure: Secondary | ICD-10-CM | POA: Diagnosis not present

## 2016-02-11 DIAGNOSIS — Z01118 Encounter for examination of ears and hearing with other abnormal findings: Secondary | ICD-10-CM | POA: Diagnosis not present

## 2016-02-18 DIAGNOSIS — F410 Panic disorder [episodic paroxysmal anxiety] without agoraphobia: Secondary | ICD-10-CM | POA: Diagnosis not present

## 2016-02-18 DIAGNOSIS — F4311 Post-traumatic stress disorder, acute: Secondary | ICD-10-CM | POA: Diagnosis not present

## 2016-03-22 DIAGNOSIS — F4310 Post-traumatic stress disorder, unspecified: Secondary | ICD-10-CM | POA: Diagnosis not present

## 2016-03-28 DIAGNOSIS — E559 Vitamin D deficiency, unspecified: Secondary | ICD-10-CM | POA: Diagnosis not present

## 2016-03-28 DIAGNOSIS — I10 Essential (primary) hypertension: Secondary | ICD-10-CM | POA: Diagnosis not present

## 2016-03-28 DIAGNOSIS — I119 Hypertensive heart disease without heart failure: Secondary | ICD-10-CM | POA: Diagnosis not present

## 2016-03-28 DIAGNOSIS — K219 Gastro-esophageal reflux disease without esophagitis: Secondary | ICD-10-CM | POA: Diagnosis not present

## 2016-04-10 ENCOUNTER — Other Ambulatory Visit (HOSPITAL_COMMUNITY): Payer: Self-pay | Admitting: Surgery

## 2016-04-17 ENCOUNTER — Ambulatory Visit (HOSPITAL_COMMUNITY): Payer: BLUE CROSS/BLUE SHIELD

## 2016-04-17 ENCOUNTER — Other Ambulatory Visit (HOSPITAL_COMMUNITY): Payer: BLUE CROSS/BLUE SHIELD

## 2016-04-26 ENCOUNTER — Ambulatory Visit (HOSPITAL_COMMUNITY)
Admission: RE | Admit: 2016-04-26 | Discharge: 2016-04-26 | Disposition: A | Discharge status: Home / Self Care | Payer: BLUE CROSS/BLUE SHIELD | Source: Ambulatory Visit | Attending: Surgery | Admitting: Surgery

## 2016-04-26 DIAGNOSIS — Z01818 Encounter for other preprocedural examination: Secondary | ICD-10-CM | POA: Insufficient documentation

## 2016-04-26 DIAGNOSIS — R9431 Abnormal electrocardiogram [ECG] [EKG]: Secondary | ICD-10-CM | POA: Diagnosis not present

## 2016-04-26 DIAGNOSIS — Z01810 Encounter for preprocedural cardiovascular examination: Secondary | ICD-10-CM | POA: Diagnosis not present

## 2016-05-24 ENCOUNTER — Encounter: Payer: BLUE CROSS/BLUE SHIELD | Attending: Surgery | Admitting: Registered"

## 2016-05-24 ENCOUNTER — Encounter: Payer: Self-pay | Admitting: Registered"

## 2016-05-24 DIAGNOSIS — Z6841 Body Mass Index (BMI) 40.0 and over, adult: Secondary | ICD-10-CM | POA: Diagnosis not present

## 2016-05-24 DIAGNOSIS — Z713 Dietary counseling and surveillance: Secondary | ICD-10-CM | POA: Diagnosis not present

## 2016-05-24 DIAGNOSIS — E669 Obesity, unspecified: Secondary | ICD-10-CM

## 2016-05-24 NOTE — Progress Notes (Signed)
Pre-Op Assessment Visit:  Pre-Operative Sleeve Gastrectomy Surgery  Medical Nutrition Therapy:  Appt start time: 8:16  End time:  9:25  Patient was seen on 05/24/2016 for Pre-Operative Nutrition Assessment. Assessment and letter of approval faxed to [facility] Bariatric Surgery Program coordinator on 05/24/2016.   Pt expectation of surgery: develop a healthier lifestyle, to do more, decrease blood pressure pills  Pt expectation of Dietitian: meal planning, do's and don'ts  Start weight at [facility]: 402.4 BMI: 57.74   Pt states she wasn't eating healthy during weight loss programs. Pt reports she sometimes forgets to take her blood pressure pills. Pt states she wants to hear the truth when it comes to what her food habits/choices do to her body. Pt states she used to play basketball in high school and in college and looking forward to being able to be active again with her son.  Pt states she will contact [facility] or insurance company to verify the amount of visits needed prior to surgery and will contact us to schedule her next appt.    24 hr Dietary Recall: First Meal: normal skips, grits, sausage Snack: none Second Meal: broccoli & cheese soup, grilled chicken and mixed vegetables, chicken wings Snack: popcorn, chips, fruit Third Meal: burger, baked chicken, fried chicken, fries, seasoned rice, green beans, corn, broccoli Snack: none Beverages: coffee w/ sugar & creamer, water, tea  Encouraged to engage in 150 minutes of moderate physical activity including cardiovascular and weight baring weekly  Handouts given during visit include:  . Pre-Op Goals . Bariatric Surgery Protein Shakes During the appointment today the following Pre-Op Goals were reviewed with the patient: . Maintain or lose weight as instructed by your surgeon . Make healthy food choices . Begin to limit portion sizes . Limited concentrated sugars and fried foods . Keep fat/sugar in the single digits per  serving on          food labels . Practice CHEWING your food  (aim for 30 chews per bite or until applesauce consistency) . Practice not drinking 15 minutes before, during, and 30 minutes after each meal/snack . Avoid all carbonated beverages  . Avoid/limit caffeinated beverages  . Avoid all sugar-sweetened beverages . Consume 3 meals per day; eat every 3-5 hours . Make a list of non-food related activities . Aim for 64-100 ounces of FLUID daily  . Aim for at least 60-80 grams of PROTEIN daily . Look for a liquid protein source that contain ?15 g protein and ?5 g carbohydrate  (ex: shakes, drinks, shots)  -Follow diet recommendations listed below   Energy and Macronutrient Recomendations: Calories: 1800 Carbohydrate: 200 Protein: 135 Fat: 50  Demonstrated degree of understanding via:  Teach Back  Teaching Method Utilized:  Visual Auditory Hands on  Barriers to learning/adherence to lifestyle change: none identified   Patient to call the [facility] Education [facility] to enroll in Pre-Op and Post-Op Nutrition Education when surgery date is scheduled.

## 2016-05-24 NOTE — Patient Instructions (Signed)
-   Increase physical activity to 30 minutes at least 5 times a week

## 2016-07-14 ENCOUNTER — Ambulatory Visit: Payer: BLUE CROSS/BLUE SHIELD | Admitting: Registered"

## 2016-07-21 ENCOUNTER — Encounter: Payer: Self-pay | Admitting: Registered"

## 2016-07-21 ENCOUNTER — Encounter: Payer: BLUE CROSS/BLUE SHIELD | Attending: Surgery | Admitting: Registered"

## 2016-07-21 DIAGNOSIS — Z713 Dietary counseling and surveillance: Secondary | ICD-10-CM | POA: Insufficient documentation

## 2016-07-21 DIAGNOSIS — Z6841 Body Mass Index (BMI) 40.0 and over, adult: Secondary | ICD-10-CM | POA: Diagnosis not present

## 2016-07-21 DIAGNOSIS — E669 Obesity, unspecified: Secondary | ICD-10-CM

## 2016-07-21 NOTE — Progress Notes (Signed)
Appt start time: 8:30 end time: 9:00  Assessment: 1st SWL Appointment.   Start Wt at [facility]: 402.4 Wt: 401.8 BMI: 57.65   Pt arrives having 0.6 lbs from previous visit. Pt states she has not been drinking sodas; has tried Twist. Pt states she works Mon-Thurs 10-9pm. Pt has eliminated rice and pasta; does not eat bread.  Pt states she needs 6 visits altogether and would like them scheduled twice/month if possible.  Works    MEDICATIONS: See list   DIETARY INTAKE:  24-hr recall:  B ( AM): skipping   Snk ( AM): none  L ( PM): grilled chicken, cheese, vegetables Snk ( PM): ice cream D ( PM): baked chicken or steak, vegetables Snk ( PM): none Beverages: water, tea (half unsweet and half sweet)  Usual physical activity: none  Diet to Follow: Calories: 1800 kcals Carbohydrate: 200g Protein: 135g Fat: 50g  Preferred Learning Style:   No preference indicated   Learning Readiness:   Ready  Change in progress     Nutritional Diagnosis:  [place]-3.3 Overweight/obesity related to past poor dietary habits and physical inactivity as evidenced by patient w/ planned sleeve gastrectomy surgery following dietary guidelines for continued weight loss.    Intervention:  Nutrition counseling for upcoming Bariatric Surgery.  Goals:  - Add in breakfast. Find protein shake flavor that you enjoy that has at least 15g or more of protein and 5g or less of carbohydrates. - Increase physical activity to 30 min, 3 times per week at [employer]  Teaching Method Utilized:  Visual Auditory Hands on  Handouts given during visit include:  Protein Shakes  Barriers to learning/adherence to lifestyle change: none  Demonstrated degree of understanding via:  Teach Back   Monitoring/Evaluation:  Dietary intake, exercise, and body weight in 21 day(s).

## 2016-07-21 NOTE — Patient Instructions (Addendum)
-   Add in breakfast. Find protein shake flavor that you enjoy that has at least 15g or more of protein and 5g or less of carbohydrates.  - Increase physical activity to 30 min, 3 times per week at [employer]

## 2016-08-18 ENCOUNTER — Encounter: Payer: BLUE CROSS/BLUE SHIELD | Attending: Surgery | Admitting: Registered"

## 2016-08-18 ENCOUNTER — Encounter: Payer: Self-pay | Admitting: Registered"

## 2016-08-18 DIAGNOSIS — E669 Obesity, unspecified: Secondary | ICD-10-CM

## 2016-08-18 DIAGNOSIS — Z713 Dietary counseling and surveillance: Secondary | ICD-10-CM | POA: Insufficient documentation

## 2016-08-18 DIAGNOSIS — Z6841 Body Mass Index (BMI) 40.0 and over, adult: Secondary | ICD-10-CM | POA: Insufficient documentation

## 2016-08-18 NOTE — Patient Instructions (Addendum)
-   Add in one more day of physical activity to have 5 days of 30 min/day.   - Try Nature's twist and also MIO drops as option instead of juice.   - Monitor and aim for at least 64 oz fluid a day.

## 2016-08-18 NOTE — Progress Notes (Signed)
Appt start time: 9:10 end time: 9:27  Assessment: 2nd SWL Appointment.   Start Wt at [facility]: 402.4 Wt: 398.4 BMI: 57.16    Has been reducing portions Walking more than normal [place] [employer] [place] Loves the [employer] Premier Protein shake and choclolate Drinks juice about 3x/week Was burnt out on flavor packs and went to juices Has cut back on sugar amounts  Pt arrives having lost 3.4 lbs from previous visit. Pt states she has been reducing portion sizes as well as walking more than normal. Pt states she likes the chocolate Premier Protein but loves the caramel flavor. Pt reports she will try the clear option as well. Pt states she drinks juice about 3x/week and working on reducing that amount. Pt states she was burned out on flavor packs added to water and began drinking more juice. Pt states she has cut back on sugar consumption. Pt states she is going [place] [employer] [place] with family and friends.   Pt states she works Mon-Thurs 10-9pm; will change in [place] M-F 9am-6pm. Pt has eliminated rice and pasta; does not eat bread.  Pt states she needs 6 visits altogether and would like them scheduled twice/month if possible.   MEDICATIONS: See list   DIETARY INTAKE:  24-hr recall:  B ( AM): Protein shake   Snk ( AM): none  L ( PM): sandwich or hot dog  Snk ( PM): popcorn or fruit or vegetables D ( PM): rice, baked chicken, cake or ice cream Snk ( PM): sometimes caramel protein shake Beverages: water, tea (half unsweet and half sweet), juice, some soda  Usual physical activity: walking at least 30 min, 4x/day  Diet to Follow: Calories: 1800 kcals Carbohydrate: 200g Protein: 135g Fat: 50g  Preferred Learning Style:   No preference indicated   Learning Readiness:   Ready  Change in progress     Nutritional Diagnosis:  [place]-3.3 Overweight/obesity related to past poor dietary habits and physical inactivity as evidenced by patient w/ planned sleeve gastrectomy  surgery following dietary guidelines for continued weight loss.    Intervention:  Nutrition counseling for upcoming Bariatric Surgery.  Goals:  - Add in one more day of physical activity to have 5 days of 30 min/day.  - Try Nature's twist and also MIO drops as option instead of juice.  - Monitor and aim for at least 64 oz fluid a day.   Teaching Method Utilized:  Visual Auditory Hands on  Handouts given during visit include:  none  Barriers to learning/adherence to lifestyle change: none  Demonstrated degree of understanding via:  Teach Back   Monitoring/Evaluation:  Dietary intake, exercise, and body weight in 21 day(s).

## 2016-09-11 ENCOUNTER — Encounter: Payer: Self-pay | Admitting: Registered"

## 2016-09-11 ENCOUNTER — Encounter: Payer: BLUE CROSS/BLUE SHIELD | Attending: Surgery | Admitting: Registered"

## 2016-09-11 DIAGNOSIS — Z6841 Body Mass Index (BMI) 40.0 and over, adult: Secondary | ICD-10-CM | POA: Insufficient documentation

## 2016-09-11 DIAGNOSIS — Z713 Dietary counseling and surveillance: Secondary | ICD-10-CM | POA: Insufficient documentation

## 2016-09-11 DIAGNOSIS — E669 Obesity, unspecified: Secondary | ICD-10-CM

## 2016-09-11 NOTE — Progress Notes (Signed)
Appt start time: 8:45 end time: 9:08  Assessment: 3rd SWL Appointment.   Start Wt at [facility]: 402.4 Wt: 398.4 BMI: 57.16   Pt arrives 15 mi late. Pt has maintained weight from previous visit. Pt states she has tried to increase physical activity to 5x/week and still working on it. Pt states she has tried Twist and its ok, enjoys Minute Maid Light Fruit Punch better. Pt states she will begin seeing personal trainer this Friday for strength training. Pt sates she is drinking at least 64 oz per day and drinking more water especially while at work. Pt states she walks around the building at least once a day while at work. Pt reports she loves popcorn.  Pt states she works Mon-Thurs 10-9pm; will change in Sept 4 M-F 9am-6pm. Pt has eliminated rice and pasta; does not eat bread.  Pt states she needs 6 visits altogether and would like them scheduled twice/month if possible.   MEDICATIONS: See list   DIETARY INTAKE:  24-hr recall:  B ( AM): Protein shake   Snk ( AM): sometimes candy or gum L ( PM): salad or sandwich  Snk ( PM): popcorn or fruit or vegetables D ( PM): rice, baked chicken, non-starchy vegetables  Snk ( PM): sometimes popcorn, nabs Beverages: water, Minute Maid light fruit punch, tea (half unsweet and half sweet), flavor packs some soda  Usual physical activity: walking at least 30 min, 4x/day  Diet to Follow: Calories: 1800 kcals Carbohydrate: 200g Protein: 135g Fat: 50g  Preferred Learning Style:   No preference indicated   Learning Readiness:   Ready  Change in progress     Nutritional Diagnosis:  [place]-3.3 Overweight/obesity related to past poor dietary habits and physical inactivity as evidenced by patient w/ planned sleeve gastrectomy surgery following dietary guidelines for continued weight loss.    Intervention:  Nutrition counseling for upcoming Bariatric Surgery.  Goals:  - Practice not drinking 15 minutes before, during, and 30 minutes after each  meal/snack.  - Begin taking Multivitamin Complete and Vitamin D supplement. - Continue to aim for physical activity at least 5 times/week.  Teaching Method Utilized:  Visual Auditory Hands on  Handouts given during visit include:  Vitamin and Mineral Recommendations  Barriers to learning/adherence to lifestyle change: none  Demonstrated degree of understanding via:  Teach Back   Monitoring/Evaluation:  Dietary intake, exercise, and body weight in 21 day(s).

## 2016-09-11 NOTE — Patient Instructions (Addendum)
-   Practice not drinking 15 minutes before, during, and 30 minutes after each meal/snack.   - Begin taking Multivitamin Complete and Vitamin D supplement.  - Continue to aim for physical activity at least 5 times/week.

## 2016-10-10 DIAGNOSIS — Z01419 Encounter for gynecological examination (general) (routine) without abnormal findings: Secondary | ICD-10-CM | POA: Diagnosis not present

## 2016-10-10 DIAGNOSIS — N926 Irregular menstruation, unspecified: Secondary | ICD-10-CM | POA: Diagnosis not present

## 2016-10-10 DIAGNOSIS — R938 Abnormal findings on diagnostic imaging of other specified body structures: Secondary | ICD-10-CM | POA: Diagnosis not present

## 2016-10-12 ENCOUNTER — Ambulatory Visit: Payer: BLUE CROSS/BLUE SHIELD | Admitting: Skilled Nursing Facility1

## 2016-10-13 ENCOUNTER — Ambulatory Visit: Payer: BLUE CROSS/BLUE SHIELD | Admitting: Registered"

## 2016-10-19 DIAGNOSIS — R938 Abnormal findings on diagnostic imaging of other specified body structures: Secondary | ICD-10-CM | POA: Diagnosis not present

## 2016-10-19 DIAGNOSIS — N8502 Endometrial intraepithelial neoplasia [EIN]: Secondary | ICD-10-CM | POA: Diagnosis not present

## 2016-10-19 DIAGNOSIS — Z01812 Encounter for preprocedural laboratory examination: Secondary | ICD-10-CM | POA: Diagnosis not present

## 2016-10-19 DIAGNOSIS — N921 Excessive and frequent menstruation with irregular cycle: Secondary | ICD-10-CM | POA: Diagnosis not present

## 2016-10-23 ENCOUNTER — Encounter: Payer: BLUE CROSS/BLUE SHIELD | Attending: Surgery | Admitting: Registered"

## 2016-10-23 ENCOUNTER — Encounter: Payer: Self-pay | Admitting: Registered"

## 2016-10-23 DIAGNOSIS — Z713 Dietary counseling and surveillance: Secondary | ICD-10-CM | POA: Insufficient documentation

## 2016-10-23 DIAGNOSIS — Z6841 Body Mass Index (BMI) 40.0 and over, adult: Secondary | ICD-10-CM | POA: Diagnosis not present

## 2016-10-23 DIAGNOSIS — E669 Obesity, unspecified: Secondary | ICD-10-CM

## 2016-10-23 NOTE — Progress Notes (Signed)
Appt start time: 9:30 end time: 9:50  Assessment: 4th SWL Appointment.   Start Wt at [facility]: 402.4 Wt: 393.1  BMI: 56.40   Pt calls and arrives 28 min late from scheduled appt time. Pt has lost 5 lbs from previous visit. Pt states her doctor recently noticed there is something blocking her left ovary and currently on medication and having tests done to investigate. Pt states she had a biopsy last Wed and will go over results this upcoming Wed 9/26. Pt states she has increased physical activity to 4-5x/week. Pt states when she does not go to gym, she walks at work. 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 Pt states she is taking Centrum gummy MVI and Vitamin D as well. Pt states she drinks coffee 1-2 times a week. Pt reports coffee is mixed with decaf at times, along with protein shake. Pt states she is going to [facility] for a few days to celebrate upcoming birthday. Pt states she is not rushing to have surgery and wants to see what is happening with ovary first.   Pt states she works M-F 9am-6pm. Pt has eliminated rice and pasta; does not eat bread (except once a week with burger).   Pt states she needs 6 visits altogether.    MEDICATIONS: See list   DIETARY INTAKE:  24-hr recall:  B ( AM): Protein shake or scrambled egg and cheese, 2 [place] sausage links   Snk ( AM): fruit  L ( PM): grilled chicken, cheese with sauteed vegetables or burger without bread Snk ( PM): popcorn or cheese doodles D ( PM): baked tilapia, sauteed non-starchy vegetables  Snk ( PM): sometimes popcorn, nabs Beverages: water, Minute Maid light fruit punch, tea (half unsweet and half sweet), flavor packs some soda, coffee w/ caramel/cookies and cream protein shake  Usual physical activity: Gym-elliptical, biking, treadmill, stair climbing; walking on non-gym days at least 30 min, 4-5x/day  Diet to Follow: Calories: 1800 kcals Carbohydrate: 200g Protein: 135g Fat: 50g  Preferred Learning Style:    No preference indicated   Learning Readiness:   Ready  Change in progress     Nutritional Diagnosis:  [place]-3.3 Overweight/obesity related to past poor dietary habits and physical inactivity as evidenced by patient w/ planned sleeve gastrectomy surgery following dietary guidelines for continued weight loss.    Intervention:  Nutrition counseling for upcoming Bariatric Surgery.  Goals:  - Continue to aim to wait 30 min after eating. Use Baritastic App to set timers. - Aim to chew at least 30 times per bite or to applesauce consistency.   Teaching Method Utilized:  Visual Auditory Hands on  Handouts given during visit include:  Vitamin and Mineral Recommendations  Barriers to learning/adherence to lifestyle change: none  Demonstrated degree of understanding via:  Teach Back   Monitoring/Evaluation:  Dietary intake, exercise, and body weight in 21 day(s).

## 2016-10-23 NOTE — Patient Instructions (Addendum)
-   Continue to aim to wait 30 min after eating. Use Baritastic App to set timers.  - Aim to chew at least 30 times per bite or to applesauce consistency.

## 2016-10-25 DIAGNOSIS — N8502 Endometrial intraepithelial neoplasia [EIN]: Secondary | ICD-10-CM | POA: Diagnosis not present

## 2016-11-02 DIAGNOSIS — N8502 Endometrial intraepithelial neoplasia [EIN]: Secondary | ICD-10-CM | POA: Diagnosis not present

## 2016-11-02 DIAGNOSIS — N8501 Benign endometrial hyperplasia: Secondary | ICD-10-CM | POA: Diagnosis not present

## 2016-11-22 ENCOUNTER — Encounter: Payer: BLUE CROSS/BLUE SHIELD | Attending: Surgery | Admitting: Registered"

## 2016-11-22 ENCOUNTER — Encounter: Payer: Self-pay | Admitting: Registered"

## 2016-11-22 DIAGNOSIS — Z713 Dietary counseling and surveillance: Secondary | ICD-10-CM | POA: Insufficient documentation

## 2016-11-22 DIAGNOSIS — Z6841 Body Mass Index (BMI) 40.0 and over, adult: Secondary | ICD-10-CM | POA: Diagnosis not present

## 2016-11-22 DIAGNOSIS — E669 Obesity, unspecified: Secondary | ICD-10-CM

## 2016-11-22 NOTE — Patient Instructions (Addendum)
-   Get back into physical activity routine after Fri, 10/26: gym and walking on non-gym days. At least 30 min a day, 5 days a week.   - Keep up the great work!

## 2016-11-22 NOTE — Progress Notes (Signed)
Appt start time: 8:02 end time: 8:18  Assessment: 5th SWL Appointment.   Start Wt at [facility]: 402.4 Wt: 393.7  BMI: 56.49   Pt arrives having maintained weight from previous visit. 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 Pt reports she will have a procedure this Fri to assess status of ovary. Pt states she has become anemic since the last visit; now waking iron pills as well. Pt states she has been chewing ice a lot lately.   Pt states she has been waiting 30 min after eating by setting a stop watch on her cellphone. Pt states she is also doing well with chewing. Pt states she drinks decaf coffee at times on cold days. Pt states she has not been able to work out lately, but plans to return to her routine after she has her procedure on Fri, 10/26.   Pt states she works M-F 9am-6pm. Pt has eliminated rice and pasta; does not eat bread (except once a week with burger).   Pt states she needs 6 visits altogether.    MEDICATIONS: See list   DIETARY INTAKE:  24-hr recall:  B ( AM): Protein shake or scrambled egg and cheese, 2 [place] sausage links   Snk ( AM): fruit  L ( PM): grilled chicken, cheese with sauteed vegetables or burger without bread Snk ( PM): popcorn or cheese doodles D ( PM): baked tilapia, sauteed non-starchy vegetables  Snk ( PM): sometimes popcorn, nabs Beverages: water, Minute Maid light fruit punch, tea (half unsweet and half sweet), flavor packs some soda, decaf coffee w/ caramel/cookies and cream protein shake  Usual physical activity: Gym-elliptical, biking, treadmill, stair climbing; walking on non-gym days at least 30 min, 4-5x/day; unable to exercise recently  Diet to Follow: Calories: 1800 kcals Carbohydrate: 200g Protein: 135g Fat: 50g  Preferred Learning Style:   No preference indicated   Learning Readiness:   Ready  Change in progress     Nutritional Diagnosis:   [place]-3.3 Overweight/obesity related to past poor dietary habits and physical inactivity as evidenced by patient w/ planned sleeve gastrectomy surgery following dietary guidelines for continued weight loss.    Intervention:  Nutrition counseling for upcoming Bariatric Surgery.  Goals:  - Get back into physical activity routine after Fri, 10/26: gym and walking on non-gym days. At least 30 min a day, 5 days a week.  - Keep up the great work!  Teaching Method Utilized:  Visual Auditory Hands on  Handouts given during visit include:  none  Barriers to learning/adherence to lifestyle change: none  Demonstrated degree of understanding via:  Teach Back   Monitoring/Evaluation:  Dietary intake, exercise, and body weight in 1 month(s).

## 2016-11-23 DIAGNOSIS — Z79890 Hormone replacement therapy (postmenopausal): Secondary | ICD-10-CM | POA: Diagnosis not present

## 2016-11-23 DIAGNOSIS — D509 Iron deficiency anemia, unspecified: Secondary | ICD-10-CM | POA: Diagnosis not present

## 2016-11-23 DIAGNOSIS — Z880 Allergy status to penicillin: Secondary | ICD-10-CM | POA: Diagnosis not present

## 2016-11-23 DIAGNOSIS — N939 Abnormal uterine and vaginal bleeding, unspecified: Secondary | ICD-10-CM | POA: Diagnosis not present

## 2016-11-23 DIAGNOSIS — Z79899 Other long term (current) drug therapy: Secondary | ICD-10-CM | POA: Diagnosis not present

## 2016-11-23 DIAGNOSIS — I10 Essential (primary) hypertension: Secondary | ICD-10-CM | POA: Diagnosis not present

## 2016-11-23 DIAGNOSIS — Z791 Long term (current) use of non-steroidal anti-inflammatories (NSAID): Secondary | ICD-10-CM | POA: Diagnosis not present

## 2016-11-23 DIAGNOSIS — C541 Malignant neoplasm of endometrium: Secondary | ICD-10-CM | POA: Diagnosis not present

## 2016-11-23 DIAGNOSIS — Z6841 Body Mass Index (BMI) 40.0 and over, adult: Secondary | ICD-10-CM | POA: Diagnosis not present

## 2016-11-24 DIAGNOSIS — N840 Polyp of corpus uteri: Secondary | ICD-10-CM | POA: Diagnosis not present

## 2016-11-24 DIAGNOSIS — C541 Malignant neoplasm of endometrium: Secondary | ICD-10-CM | POA: Diagnosis not present

## 2016-11-24 DIAGNOSIS — N939 Abnormal uterine and vaginal bleeding, unspecified: Secondary | ICD-10-CM | POA: Diagnosis not present

## 2016-11-24 DIAGNOSIS — Z6841 Body Mass Index (BMI) 40.0 and over, adult: Secondary | ICD-10-CM | POA: Diagnosis not present

## 2016-11-24 DIAGNOSIS — Z880 Allergy status to penicillin: Secondary | ICD-10-CM | POA: Diagnosis not present

## 2016-11-24 DIAGNOSIS — Z791 Long term (current) use of non-steroidal anti-inflammatories (NSAID): Secondary | ICD-10-CM | POA: Diagnosis not present

## 2016-11-24 DIAGNOSIS — D509 Iron deficiency anemia, unspecified: Secondary | ICD-10-CM | POA: Diagnosis not present

## 2016-11-24 DIAGNOSIS — Z79899 Other long term (current) drug therapy: Secondary | ICD-10-CM | POA: Diagnosis not present

## 2016-11-24 DIAGNOSIS — N8502 Endometrial intraepithelial neoplasia [EIN]: Secondary | ICD-10-CM | POA: Diagnosis not present

## 2016-11-24 DIAGNOSIS — I10 Essential (primary) hypertension: Secondary | ICD-10-CM | POA: Diagnosis not present

## 2016-11-24 DIAGNOSIS — Z79890 Hormone replacement therapy (postmenopausal): Secondary | ICD-10-CM | POA: Diagnosis not present

## 2016-12-18 ENCOUNTER — Ambulatory Visit: Payer: BLUE CROSS/BLUE SHIELD | Admitting: Registered"

## 2016-12-26 ENCOUNTER — Encounter: Payer: BLUE CROSS/BLUE SHIELD | Attending: Surgery | Admitting: Skilled Nursing Facility1

## 2016-12-26 ENCOUNTER — Encounter: Payer: Self-pay | Admitting: Skilled Nursing Facility1

## 2016-12-26 DIAGNOSIS — Z713 Dietary counseling and surveillance: Secondary | ICD-10-CM | POA: Diagnosis not present

## 2016-12-26 DIAGNOSIS — E669 Obesity, unspecified: Secondary | ICD-10-CM

## 2016-12-26 DIAGNOSIS — Z6841 Body Mass Index (BMI) 40.0 and over, adult: Secondary | ICD-10-CM | POA: Diagnosis not present

## 2016-12-26 NOTE — Progress Notes (Signed)
Appt start time: 8:02 end time: 8:18  Assessment: 6th SWL Appointment.   Start Wt at [facility]: 402.4 Wt: 394  BMI: 56.62  Pt states she has a blockage of precancerous cells in her ovary. Pt states she has seen a big difference in her habits but stating she did have some trouble when finding the precancerous cells in her ovary. Changes made: no soda, not eating after 7pm, stopped eating in the middle of the night, snacking on celery/tomatoes/protein shake.     MEDICATIONS: See list   DIETARY INTAKE:  24-hr recall:  B ( AM): skipped or protein shake  Snk ( AM): fruit  L ( PM): grilled chicken salad or [place] burger  Snk ( PM): sweet potato or rice cakes or cheese doodles D ( PM): baked tilapia, sauteed non-starchy vegetables and potatoes Snk ( PM): protein shake Beverages: water, Minute Maid light fruit punch, tea (half unsweet and half sweet), flavor packs, decaf coffee w/ caramel/cookies and cream protein shake, unsweet natures twist  Usual physical activity: Gym-elliptical, treadmill, stair climbing; walking on non-gym days at least 30 min, 3  Diet to Follow: Calories: 1800 kcals Carbohydrate: 200g Protein: 135g Fat: 50g  Preferred Learning Style:   No preference indicated   Learning Readiness:   Ready  Change in progress     Nutritional Diagnosis:  [place]-3.3 Overweight/obesity related to past poor dietary habits and physical inactivity as evidenced by patient w/ planned sleeve gastrectomy surgery following dietary guidelines for continued weight loss.    Intervention:  Nutrition counseling for upcoming Bariatric Surgery.  Goals:  - Get back into physical activity routine after Fri, 10/26: gym and walking on non-gym days. At least 30 min a day, 5 days a week.  - Keep up the great work!  Teaching Method Utilized:  Visual Auditory Hands on  Handouts given during visit include:  none  Barriers to learning/adherence to lifestyle change: none  Demonstrated degree  of understanding via:  Teach Back   Monitoring/Evaluation:  Dietary intake, exercise, and body weight in 1 month(s).

## 2017-01-08 ENCOUNTER — Ambulatory Visit: Payer: BLUE CROSS/BLUE SHIELD

## 2017-01-09 ENCOUNTER — Encounter: Payer: BLUE CROSS/BLUE SHIELD | Attending: Surgery | Admitting: Skilled Nursing Facility1

## 2017-01-09 DIAGNOSIS — Z6841 Body Mass Index (BMI) 40.0 and over, adult: Secondary | ICD-10-CM | POA: Diagnosis not present

## 2017-01-09 DIAGNOSIS — Z713 Dietary counseling and surveillance: Secondary | ICD-10-CM | POA: Diagnosis not present

## 2017-01-09 DIAGNOSIS — E669 Obesity, unspecified: Secondary | ICD-10-CM

## 2017-01-10 ENCOUNTER — Encounter: Payer: Self-pay | Admitting: Skilled Nursing Facility1

## 2017-01-10 NOTE — Progress Notes (Signed)
Pre-Operative Nutrition Class:  Appt start time: 0086   End time:  1830.  Patient was seen on 01/09/2017 for Pre-Operative Bariatric Surgery Education [facility]   Surgery date: Surgery type: sleeve Start weight at [facility]: 402.4 Weight today: 398  Samples given per MNT protocol. Patient educated on appropriate usage: Procare Multivitamin Lot # [id number] Exp: 07/2018  Bariatric Advantage Calcium  Lot # [id number] Exp: sep-08-2017  Renee Pain Protein  Shake Lot # [id number] Exp: may-28-19  The following the learning objectives were met by the patient during this course:  Identify Pre-Op Dietary Goals and will begin 2 weeks pre-operatively  Identify appropriate sources of fluids and proteins   State protein recommendations and appropriate sources pre and post-operatively  Identify Post-Operative Dietary Goals and will follow for 2 weeks post-operatively  Identify appropriate multivitamin and calcium sources  Describe the need for physical activity post-operatively and will follow MD recommendations  State when to call healthcare provider regarding medication questions or post-operative complications  Handouts given during class include:  Pre-Op Bariatric Surgery Diet Handout  Protein Shake Handout  Post-Op Bariatric Surgery Nutrition Handout  BELT Program Information Flyer  Support Group Information Flyer  [facility] Bariatric Supplements Price List  Follow-Up Plan: [facility] diet advancement per MD.

## 2017-01-11 DIAGNOSIS — N8501 Benign endometrial hyperplasia: Secondary | ICD-10-CM | POA: Diagnosis not present

## 2017-02-06 DIAGNOSIS — I119 Hypertensive heart disease without heart failure: Secondary | ICD-10-CM | POA: Diagnosis not present

## 2017-02-06 DIAGNOSIS — K219 Gastro-esophageal reflux disease without esophagitis: Secondary | ICD-10-CM | POA: Diagnosis not present

## 2017-02-06 DIAGNOSIS — I10 Essential (primary) hypertension: Secondary | ICD-10-CM | POA: Diagnosis not present

## 2017-02-06 DIAGNOSIS — J0100 Acute maxillary sinusitis, unspecified: Secondary | ICD-10-CM | POA: Diagnosis not present

## 2017-03-03 DIAGNOSIS — F4323 Adjustment disorder with mixed anxiety and depressed mood: Secondary | ICD-10-CM | POA: Diagnosis not present

## 2017-03-15 NOTE — Patient Instructions (Addendum)
Ina Kickngela D Steptoe  03/15/2017   Your procedure is scheduled on: 03-20-17   [facility]             Follow signs to Short Stay on first floor at 530 AM   Call this number if you have problems the morning of surgery [phone]    Remember: Do not eat food or drink liquids :After Midnight.     Take these medicines the morning of surgery with A SIP OF WATER: none                                You may not have any metal on your body including hair pins and              piercings  Do not wear jewelry, make-up, lotions, powders or perfumes, deodorant             Do not wear nail polish.  Do not shave  48 hours prior to surgery.              Men may shave face and neck.   Do not bring valuables to the hospital. [facility] IS NOT             RESPONSIBLE   FOR VALUABLES.  Contacts, dentures or bridgework may not be worn into surgery.  Leave suitcase in the car. After surgery it may be brought to your room.                Please read over the following fact sheets you were given: _____________________________________________________________________           [facility] - Preparing [facility] Before surgery, you can play an important role.  Because skin is not sterile, your skin needs to be as free of germs as possible.  You can reduce the number of germs on your skin by washing with CHG (chlorahexidine gluconate) soap before surgery.  CHG is an antiseptic cleaner which kills germs and bonds with the skin to continue killing germs even after washing. Please DO NOT use if you have an allergy to CHG or antibacterial soaps.  If your skin becomes reddened/irritated stop using the CHG and inform your nurse when you arrive at Short Stay. Do not shave (including legs and underarms) for at least 48 hours prior to the first CHG shower.  You may shave your face/neck. Please follow these instructions carefully:  1.  Shower with CHG Soap the night  before surgery and the  morning of Surgery.  2.  If you choose to wash your hair, wash your hair first as usual with your  normal  shampoo.  3.  After you shampoo, rinse your hair and body thoroughly to remove the  shampoo.                           4.  Use CHG as you would any other liquid soap.  You can apply chg directly  to the skin and wash                       Gently with a scrungie or clean washcloth.  5.  Apply the CHG Soap to your body ONLY FROM THE NECK DOWN.   Do not  use on face/ open                           Wound or open sores. Avoid contact with eyes, ears mouth and genitals (private parts).                       Wash face,  Genitals (private parts) with your normal soap.             6.  Wash thoroughly, paying special attention to the area where your surgery  will be performed.  7.  Thoroughly rinse your body with warm water from the neck down.  8.  DO NOT shower/wash with your normal soap after using and rinsing off  the CHG Soap.                9.  Pat yourself dry with a clean towel.            10.  Wear clean pajamas.            11.  Place clean sheets on your bed the night of your first shower and do not  sleep with pets. Day of Surgery : Do not apply any lotions/deodorants the morning of surgery.  Please wear clean clothes to the hospital/surgery center.  FAILURE TO FOLLOW THESE INSTRUCTIONS MAY RESULT IN THE CANCELLATION OF YOUR SURGERY PATIENT SIGNATURE_________________________________  NURSE SIGNATURE__________________________________  ________________________________________________________________________

## 2017-03-15 NOTE — Progress Notes (Signed)
Please place orders in epic pt. Has a preop apt. 03-16-17 Thank you!

## 2017-03-16 ENCOUNTER — Other Ambulatory Visit: Payer: Self-pay

## 2017-03-16 ENCOUNTER — Encounter (HOSPITAL_COMMUNITY): Payer: Self-pay

## 2017-03-16 ENCOUNTER — Encounter (HOSPITAL_COMMUNITY)
Admission: RE | Admit: 2017-03-16 | Discharge: 2017-03-16 | Disposition: A | Discharge status: Home / Self Care | Payer: BLUE CROSS/BLUE SHIELD | Source: Ambulatory Visit | Attending: Surgery | Admitting: Surgery

## 2017-03-16 DIAGNOSIS — I10 Essential (primary) hypertension: Secondary | ICD-10-CM | POA: Diagnosis not present

## 2017-03-16 DIAGNOSIS — Z6841 Body Mass Index (BMI) 40.0 and over, adult: Secondary | ICD-10-CM | POA: Insufficient documentation

## 2017-03-16 DIAGNOSIS — Z01812 Encounter for preprocedural laboratory examination: Secondary | ICD-10-CM | POA: Diagnosis not present

## 2017-03-16 LAB — CBC
HEMATOCRIT: 35.3 % — AB (ref 36.0–46.0)
HEMOGLOBIN: 11 g/dL — AB (ref 12.0–15.0)
MCH: 25.2 pg — ABNORMAL LOW (ref 26.0–34.0)
MCHC: 31.2 g/dL (ref 30.0–36.0)
MCV: 80.8 fL (ref 78.0–100.0)
Platelets: 408 10*3/uL — ABNORMAL HIGH (ref 150–400)
RBC: 4.37 MIL/uL (ref 3.87–5.11)
RDW: 16.2 % — ABNORMAL HIGH (ref 11.5–15.5)
WBC: 10 10*3/uL (ref 4.0–10.5)

## 2017-03-16 LAB — BASIC METABOLIC PANEL
ANION GAP: 10 (ref 5–15)
BUN: 11 mg/dL (ref 6–20)
CHLORIDE: 105 mmol/L (ref 101–111)
CO2: 25 mmol/L (ref 22–32)
Calcium: 8.9 mg/dL (ref 8.9–10.3)
Creatinine, Ser: 0.9 mg/dL (ref 0.44–1.00)
GFR calc non Af Amer: >60 mL/min (ref >60)
GLUCOSE: 105 mg/dL — AB (ref 65–99)
POTASSIUM: 3.9 mmol/L (ref 3.5–5.1)
Sodium: 140 mmol/L (ref 135–145)

## 2017-03-16 LAB — HCG, SERUM, QUALITATIVE: Preg, Serum: NEGATIVE

## 2017-03-16 NOTE — Progress Notes (Signed)
ekg 04-26-16 epic  cxr 04-26-16 epic

## 2017-03-16 NOTE — Progress Notes (Signed)
Bari bed requested for 03-20-17

## 2017-03-19 ENCOUNTER — Ambulatory Visit: Payer: Self-pay | Admitting: Surgery

## 2017-03-19 NOTE — H&P (Signed)
  Ashley Miller Location: [place] Office Patient #: [id number] DOB: 1980/10/29 Married / Language: English / Race: Black or African American Female  History of Present Illness Ashley Miller(Ashley Miller B. Ashley DeutscherMartin MD; 02/07/2017 11:31 AM) The patient is a 37 year old female who presents for a bariatric surgery evaluation. Ms. Ashley Miller is on track for sleeve gastrectomy. her weight today is 403 with a BMI of 57. She has completed her 6 months of supervised weight loss which gave her good insight into dieting. However because of a need to be placed on Megace for precancerous cells in her left ovary this causes her to have more of an appetite. Despite that she has lost a few pounds since our initial visit. Her nails about her history is the same. She denies GERD. Her upper GI was negative. She is ready for the surgery. She has no further questions.  Allergies (Ashley DevoidChemira Miller, CMA; 02/07/2017 11:12 AM) Penicillins  Itching, Vomiting. vomit if its a cap not a tab  Medication History (Ashley Miller, CMA; 02/07/2017 11:14 AM) HydroCHLOROthiazide (25MG  Tablet, Oral) Active. Valsartan (Oral) Specific strength unknown - Active. Medications Reconciled  Vitals (Ashley Miller CMA; 02/07/2017 11:12 AM) 02/07/2017 11:11 AM Weight: 403.4 lb Height: 70.25in Body Surface Area: 2.82 m Body Mass Index: 57.47 kg/m  Pulse: 109 (Regular)  BP: 150/80 (Sitting, Left Arm, Standard)  Physical Exam (Ashley Miller B. Ashley DeutscherMartin MD; 02/07/2017 11:32 AM) General  Obese AAF NADBMI 57 HEENT unremarkable Chest URI with cough under treatment Heart SR Abdomen is obese. Ext FROM   Assessment & Plan Ashley Miller(Ashley Miller B. Ashley DeutscherMartin MD; 02/07/2017 11:34 AM) MORBID OBESITY, UNSPECIFIED OBESITY TYPE (E66.01) Impression: BMi 57. Ready for sleeve gastrectomy possibly on Mar 05, 2017 She is aware of risks and benefits.

## 2017-03-20 ENCOUNTER — Encounter (HOSPITAL_COMMUNITY)
Admission: RE | Disposition: A | Discharge status: Home / Self Care | Payer: Self-pay | Source: Ambulatory Visit | Attending: Surgery

## 2017-03-20 ENCOUNTER — Inpatient Hospital Stay (HOSPITAL_COMMUNITY)
Admission: RE | Admit: 2017-03-20 | Discharge: 2017-03-21 | DRG: 621 | Disposition: A | Discharge status: Home / Self Care | Payer: BLUE CROSS/BLUE SHIELD | Source: Ambulatory Visit | Attending: Surgery | Admitting: Surgery

## 2017-03-20 ENCOUNTER — Inpatient Hospital Stay (HOSPITAL_COMMUNITY): Payer: BLUE CROSS/BLUE SHIELD | Admitting: Certified Registered Nurse Anesthetist

## 2017-03-20 ENCOUNTER — Encounter (HOSPITAL_COMMUNITY): Payer: Self-pay | Admitting: Emergency Medicine

## 2017-03-20 ENCOUNTER — Other Ambulatory Visit: Payer: Self-pay

## 2017-03-20 DIAGNOSIS — Z9884 Bariatric surgery status: Secondary | ICD-10-CM

## 2017-03-20 DIAGNOSIS — I10 Essential (primary) hypertension: Secondary | ICD-10-CM | POA: Diagnosis present

## 2017-03-20 DIAGNOSIS — J069 Acute upper respiratory infection, unspecified: Secondary | ICD-10-CM | POA: Diagnosis present

## 2017-03-20 DIAGNOSIS — K219 Gastro-esophageal reflux disease without esophagitis: Secondary | ICD-10-CM | POA: Diagnosis not present

## 2017-03-20 DIAGNOSIS — Z880 Allergy status to penicillin: Secondary | ICD-10-CM

## 2017-03-20 DIAGNOSIS — Z79899 Other long term (current) drug therapy: Secondary | ICD-10-CM

## 2017-03-20 DIAGNOSIS — Z6841 Body Mass Index (BMI) 40.0 and over, adult: Secondary | ICD-10-CM | POA: Diagnosis not present

## 2017-03-20 HISTORY — PX: LAPAROSCOPIC GASTRIC SLEEVE RESECTION: SHX5895

## 2017-03-20 LAB — CBC
HCT: 35.1 % — ABNORMAL LOW (ref 36.0–46.0)
HEMOGLOBIN: 10.9 g/dL — AB (ref 12.0–15.0)
MCH: 25.3 pg — AB (ref 26.0–34.0)
MCHC: 31.1 g/dL (ref 30.0–36.0)
MCV: 81.4 fL (ref 78.0–100.0)
PLATELETS: 393 10*3/uL (ref 150–400)
RBC: 4.31 MIL/uL (ref 3.87–5.11)
RDW: 16.1 % — ABNORMAL HIGH (ref 11.5–15.5)
WBC: 14.9 10*3/uL — ABNORMAL HIGH (ref 4.0–10.5)

## 2017-03-20 LAB — ABO/RH: ABO/RH(D): B POS

## 2017-03-20 LAB — COMPREHENSIVE METABOLIC PANEL
ALBUMIN: 3.4 g/dL — AB (ref 3.5–5.0)
ALK PHOS: 56 U/L (ref 38–126)
ALT: 21 U/L (ref 14–54)
AST: 19 U/L (ref 15–41)
Anion gap: 10 (ref 5–15)
BILIRUBIN TOTAL: 0.7 mg/dL (ref 0.3–1.2)
BUN: 11 mg/dL (ref 6–20)
CO2: 22 mmol/L (ref 22–32)
CREATININE: 0.87 mg/dL (ref 0.44–1.00)
Calcium: 8.5 mg/dL — ABNORMAL LOW (ref 8.9–10.3)
Chloride: 106 mmol/L (ref 101–111)
GFR calc Af Amer: >60 mL/min (ref >60)
GLUCOSE: 110 mg/dL — AB (ref 65–99)
POTASSIUM: 4 mmol/L (ref 3.5–5.1)
Sodium: 138 mmol/L (ref 135–145)
TOTAL PROTEIN: 7.2 g/dL (ref 6.5–8.1)

## 2017-03-20 LAB — TYPE AND SCREEN
ABO/RH(D): B POS
Antibody Screen: NEGATIVE

## 2017-03-20 LAB — CREATININE, SERUM
CREATININE: 1.05 mg/dL — AB (ref 0.44–1.00)
GFR calc non Af Amer: >60 mL/min (ref >60)

## 2017-03-20 SURGERY — GASTRECTOMY, SLEEVE, LAPAROSCOPIC
Anesthesia: General

## 2017-03-20 MED ORDER — FENTANYL CITRATE (PF) 250 MCG/5ML IJ SOLN
INTRAMUSCULAR | Status: DC | PRN
  Administered 2017-03-20: 100 ug via INTRAVENOUS
  Administered 2017-03-20: 50 ug via INTRAVENOUS
  Administered 2017-03-20: 100 ug via INTRAVENOUS

## 2017-03-20 MED ORDER — PHENYLEPHRINE 40 MCG/ML (10ML) SYRINGE FOR IV PUSH (FOR BLOOD PRESSURE SUPPORT)
PREFILLED_SYRINGE | INTRAVENOUS | Status: DC | PRN
Start: 2017-03-20 — End: 2017-03-20
  Administered 2017-03-20: 80 ug via INTRAVENOUS
  Administered 2017-03-20 (×2): 120 ug via INTRAVENOUS

## 2017-03-20 MED ORDER — LACTATED RINGERS IV SOLN
INTRAVENOUS | Status: DC | PRN
  Administered 2017-03-20 (×2): via INTRAVENOUS

## 2017-03-20 MED ORDER — ACETAMINOPHEN 500 MG PO TABS
1000.0000 mg | ORAL_TABLET | ORAL | Status: AC
  Administered 2017-03-20: 1000 mg via ORAL
  Filled 2017-03-20: qty 2

## 2017-03-20 MED ORDER — HYDRALAZINE HCL 20 MG/ML IJ SOLN: 10.0000 mg | INTRAMUSCULAR | Status: DC | PRN

## 2017-03-20 MED ORDER — SUGAMMADEX SODIUM 500 MG/5ML IV SOLN
INTRAVENOUS | Status: DC | PRN
  Administered 2017-03-20: 500 mg via INTRAVENOUS

## 2017-03-20 MED ORDER — LIDOCAINE 2% (20 MG/ML) 5 ML SYRINGE
INTRAMUSCULAR | Status: AC
  Filled 2017-03-20: qty 5

## 2017-03-20 MED ORDER — FENTANYL CITRATE (PF) 100 MCG/2ML IJ SOLN
INTRAMUSCULAR | Status: AC
  Filled 2017-03-20: qty 2

## 2017-03-20 MED ORDER — HEPARIN SODIUM (PORCINE) 5000 UNIT/ML IJ SOLN
5000.0000 [IU] | Freq: Three times a day (TID) | INTRAMUSCULAR | Status: DC
  Administered 2017-03-20 – 2017-03-21 (×3): 5000 [IU] via SUBCUTANEOUS
  Filled 2017-03-20 (×3): qty 1

## 2017-03-20 MED ORDER — MORPHINE SULFATE (PF) 4 MG/ML IV SOLN: 1.0000 mg | INTRAVENOUS | Status: DC | PRN

## 2017-03-20 MED ORDER — DEXAMETHASONE SODIUM PHOSPHATE 10 MG/ML IJ SOLN
INTRAMUSCULAR | Status: AC
  Filled 2017-03-20: qty 1

## 2017-03-20 MED ORDER — SIMETHICONE 80 MG PO CHEW
80.0000 mg | CHEWABLE_TABLET | Freq: Four times a day (QID) | ORAL | Status: DC | PRN
  Administered 2017-03-20: 80 mg via ORAL
  Filled 2017-03-20: qty 1

## 2017-03-20 MED ORDER — EPHEDRINE SULFATE-NACL 50-0.9 MG/10ML-% IV SOSY
PREFILLED_SYRINGE | INTRAVENOUS | Status: DC | PRN
  Administered 2017-03-20: 15 mg via INTRAVENOUS
  Administered 2017-03-20: 10 mg via INTRAVENOUS

## 2017-03-20 MED ORDER — PROPOFOL 10 MG/ML IV BOLUS
INTRAVENOUS | Status: DC | PRN
  Administered 2017-03-20: 200 mg via INTRAVENOUS

## 2017-03-20 MED ORDER — DEXAMETHASONE SODIUM PHOSPHATE 10 MG/ML IJ SOLN
INTRAMUSCULAR | Status: DC | PRN
  Administered 2017-03-20: 10 mg via INTRAVENOUS

## 2017-03-20 MED ORDER — CELECOXIB 200 MG PO CAPS
400.0000 mg | ORAL_CAPSULE | ORAL | Status: AC
  Administered 2017-03-20: 400 mg via ORAL
  Filled 2017-03-20: qty 2

## 2017-03-20 MED ORDER — ONDANSETRON HCL 4 MG/2ML IJ SOLN
INTRAMUSCULAR | Status: DC | PRN
  Administered 2017-03-20 (×2): 4 mg via INTRAVENOUS

## 2017-03-20 MED ORDER — LIDOCAINE 2% (20 MG/ML) 5 ML SYRINGE
INTRAMUSCULAR | Status: AC
  Filled 2017-03-20: qty 10

## 2017-03-20 MED ORDER — MIDAZOLAM HCL 2 MG/2ML IJ SOLN
INTRAMUSCULAR | Status: AC
  Filled 2017-03-20: qty 2

## 2017-03-20 MED ORDER — LIDOCAINE 2% (20 MG/ML) 5 ML SYRINGE
INTRAMUSCULAR | Status: DC | PRN
  Administered 2017-03-20: 100 mg via INTRAVENOUS

## 2017-03-20 MED ORDER — ONDANSETRON HCL 4 MG/2ML IJ SOLN
INTRAMUSCULAR | Status: AC
  Filled 2017-03-20: qty 2

## 2017-03-20 MED ORDER — ACETAMINOPHEN 160 MG/5ML PO SOLN: 650.0000 mg | ORAL | Status: DC | PRN

## 2017-03-20 MED ORDER — ONDANSETRON HCL 4 MG/2ML IJ SOLN: 4.0000 mg | INTRAMUSCULAR | Status: DC | PRN

## 2017-03-20 MED ORDER — LACTATED RINGERS IR SOLN
Status: DC | PRN
  Administered 2017-03-20: 1000 mL

## 2017-03-20 MED ORDER — KETAMINE HCL 10 MG/ML IJ SOLN
INTRAMUSCULAR | Status: AC
  Filled 2017-03-20: qty 1

## 2017-03-20 MED ORDER — CEFOTETAN DISODIUM-DEXTROSE 2-2.08 GM-%(50ML) IV SOLR
2.0000 g | INTRAVENOUS | Status: AC
  Administered 2017-03-20: 2 g via INTRAVENOUS
  Filled 2017-03-20: qty 50

## 2017-03-20 MED ORDER — HEPARIN SODIUM (PORCINE) 5000 UNIT/ML IJ SOLN
5000.0000 [IU] | INTRAMUSCULAR | Status: AC
  Administered 2017-03-20: 5000 [IU] via SUBCUTANEOUS
  Filled 2017-03-20: qty 1

## 2017-03-20 MED ORDER — PANTOPRAZOLE SODIUM 40 MG IV SOLR
40.0000 mg | Freq: Every day | INTRAVENOUS | Status: DC
  Administered 2017-03-20: 40 mg via INTRAVENOUS
  Filled 2017-03-20: qty 40

## 2017-03-20 MED ORDER — CHLORHEXIDINE GLUCONATE CLOTH 2 % EX PADS: 6.0000 | MEDICATED_PAD | Freq: Once | CUTANEOUS | Status: DC

## 2017-03-20 MED ORDER — DEXAMETHASONE SODIUM PHOSPHATE 4 MG/ML IJ SOLN: 4.0000 mg | INTRAMUSCULAR | Status: DC

## 2017-03-20 MED ORDER — OXYCODONE HCL 5 MG/5ML PO SOLN
5.0000 mg | Freq: Once | ORAL | Status: DC | PRN
  Filled 2017-03-20: qty 5

## 2017-03-20 MED ORDER — ROCURONIUM BROMIDE 10 MG/ML (PF) SYRINGE
PREFILLED_SYRINGE | INTRAVENOUS | Status: AC
  Filled 2017-03-20: qty 5

## 2017-03-20 MED ORDER — PREMIER PROTEIN SHAKE
2.0000 [oz_av] | ORAL | Status: DC
  Administered 2017-03-21 (×3): 2 [oz_av] via ORAL

## 2017-03-20 MED ORDER — LIDOCAINE 2% (20 MG/ML) 5 ML SYRINGE
INTRAMUSCULAR | Status: DC | PRN
  Administered 2017-03-20: 1.5 mg/kg/h via INTRAVENOUS

## 2017-03-20 MED ORDER — ROCURONIUM BROMIDE 50 MG/5ML IV SOSY
PREFILLED_SYRINGE | INTRAVENOUS | Status: DC | PRN
  Administered 2017-03-20 (×3): 20 mg via INTRAVENOUS
  Administered 2017-03-20: 60 mg via INTRAVENOUS
  Administered 2017-03-20: 20 mg via INTRAVENOUS

## 2017-03-20 MED ORDER — FENTANYL CITRATE (PF) 100 MCG/2ML IJ SOLN
25.0000 ug | INTRAMUSCULAR | Status: DC | PRN
  Administered 2017-03-20 (×2): 50 ug via INTRAVENOUS

## 2017-03-20 MED ORDER — SUGAMMADEX SODIUM 500 MG/5ML IV SOLN
INTRAVENOUS | Status: AC
  Filled 2017-03-20: qty 5

## 2017-03-20 MED ORDER — PROPOFOL 10 MG/ML IV BOLUS
INTRAVENOUS | Status: AC
  Filled 2017-03-20: qty 20

## 2017-03-20 MED ORDER — SCOPOLAMINE 1 MG/3DAYS TD PT72
1.0000 | MEDICATED_PATCH | TRANSDERMAL | Status: DC
  Administered 2017-03-20: 1.5 mg via TRANSDERMAL
  Filled 2017-03-20: qty 1

## 2017-03-20 MED ORDER — MIDAZOLAM HCL 5 MG/5ML IJ SOLN
INTRAMUSCULAR | Status: DC | PRN
  Administered 2017-03-20: 2 mg via INTRAVENOUS

## 2017-03-20 MED ORDER — SUCCINYLCHOLINE CHLORIDE 200 MG/10ML IV SOSY
PREFILLED_SYRINGE | INTRAVENOUS | Status: AC
  Filled 2017-03-20: qty 10

## 2017-03-20 MED ORDER — BUPIVACAINE LIPOSOME 1.3 % IJ SUSP
20.0000 mL | Freq: Once | INTRAMUSCULAR | Status: AC
  Administered 2017-03-20: 20 mL
  Filled 2017-03-20: qty 20

## 2017-03-20 MED ORDER — OXYCODONE HCL 5 MG PO TABS: 5.0000 mg | ORAL_TABLET | Freq: Once | ORAL | Status: DC | PRN

## 2017-03-20 MED ORDER — GABAPENTIN 300 MG PO CAPS
300.0000 mg | ORAL_CAPSULE | ORAL | Status: AC
  Administered 2017-03-20: 300 mg via ORAL
  Filled 2017-03-20: qty 1

## 2017-03-20 MED ORDER — APREPITANT 40 MG PO CAPS
40.0000 mg | ORAL_CAPSULE | ORAL | Status: AC
  Administered 2017-03-20: 40 mg via ORAL
  Filled 2017-03-20: qty 1

## 2017-03-20 MED ORDER — OXYCODONE HCL 5 MG/5ML PO SOLN
5.0000 mg | ORAL | Status: DC | PRN
  Administered 2017-03-21: 5 mg via ORAL
  Administered 2017-03-21: 10 mg via ORAL
  Filled 2017-03-20: qty 5
  Filled 2017-03-20: qty 10

## 2017-03-20 MED ORDER — GABAPENTIN 250 MG/5ML PO SOLN
200.0000 mg | Freq: Two times a day (BID) | ORAL | Status: DC
  Administered 2017-03-20 – 2017-03-21 (×2): 200 mg via ORAL
  Filled 2017-03-20 (×2): qty 4

## 2017-03-20 MED ORDER — KCL IN DEXTROSE-NACL 20-5-0.45 MEQ/L-%-% IV SOLN
INTRAVENOUS | Status: DC
  Administered 2017-03-20: 19:00:00 via INTRAVENOUS
  Filled 2017-03-20 (×2): qty 1000

## 2017-03-20 MED ORDER — SUCCINYLCHOLINE CHLORIDE 200 MG/10ML IV SOSY
PREFILLED_SYRINGE | INTRAVENOUS | Status: DC | PRN
  Administered 2017-03-20: 140 mg via INTRAVENOUS

## 2017-03-20 MED ORDER — FENTANYL CITRATE (PF) 250 MCG/5ML IJ SOLN
INTRAMUSCULAR | Status: AC
  Filled 2017-03-20: qty 5

## 2017-03-20 SURGICAL SUPPLY — 59 items
APPLICATOR COTTON TIP 6IN STRL (MISCELLANEOUS) IMPLANT
APPLIER CLIP 5 13 M/L LIGAMAX5 (MISCELLANEOUS)
APPLIER CLIP ROT 10 11.4 M/L (STAPLE)
APPLIER CLIP ROT 13.4 12 LRG (CLIP)
BLADE SURG 15 STRL LF DISP TIS (BLADE) ×1 IMPLANT
BLADE SURG 15 STRL SS (BLADE) ×2
CABLE HIGH FREQUENCY MONO STRZ (ELECTRODE) ×3 IMPLANT
CLIP APPLIE 5 13 M/L LIGAMAX5 (MISCELLANEOUS) IMPLANT
CLIP APPLIE ROT 10 11.4 M/L (STAPLE) IMPLANT
CLIP APPLIE ROT 13.4 12 LRG (CLIP) IMPLANT
DERMABOND ADVANCED (GAUZE/BANDAGES/DRESSINGS)
DERMABOND ADVANCED .7 DNX12 (GAUZE/BANDAGES/DRESSINGS) IMPLANT
DEVICE SUT QUICK LOAD TK 5 (STAPLE) IMPLANT
DEVICE SUT TI-KNOT TK 5X26 (MISCELLANEOUS) IMPLANT
DEVICE SUTURE ENDOST 10MM (ENDOMECHANICALS) IMPLANT
DEVICE TI KNOT TK5 (MISCELLANEOUS)
DISSECTOR BLUNT TIP ENDO 5MM (MISCELLANEOUS) IMPLANT
ELECT REM PT RETURN 15FT ADLT (MISCELLANEOUS) ×3 IMPLANT
GAUZE SPONGE 4X4 12PLY STRL (GAUZE/BANDAGES/DRESSINGS) IMPLANT
GLOVE BIOGEL M 8.0 STRL (GLOVE) ×3 IMPLANT
GOWN STRL REUS W/TWL XL LVL3 (GOWN DISPOSABLE) ×12 IMPLANT
GRASPER SUT TROCAR 14GX15 (MISCELLANEOUS) ×3 IMPLANT
HANDLE STAPLE EGIA 4 XL (STAPLE) ×3 IMPLANT
HOVERMATT SINGLE USE (MISCELLANEOUS) ×3 IMPLANT
KIT BASIN OR (CUSTOM PROCEDURE TRAY) ×3 IMPLANT
MARKER SKIN DUAL TIP RULER LAB (MISCELLANEOUS) ×3 IMPLANT
NEEDLE SPNL 22GX3.5 QUINCKE BK (NEEDLE) ×3 IMPLANT
PACK UNIVERSAL I (CUSTOM PROCEDURE TRAY) ×3 IMPLANT
QUICK LOAD TK 5 (STAPLE)
RELOAD TRI 45 ART MED THCK BLK (STAPLE) ×3 IMPLANT
RELOAD TRI 45 ART MED THCK PUR (STAPLE) ×3 IMPLANT
RELOAD TRI 60 ART MED THCK BLK (STAPLE) ×3 IMPLANT
RELOAD TRI 60 ART MED THCK PUR (STAPLE) ×15 IMPLANT
SCISSORS LAP 5X45 EPIX DISP (ENDOMECHANICALS) IMPLANT
SET IRRIG TUBING LAPAROSCOPIC (IRRIGATION / IRRIGATOR) ×3 IMPLANT
SHEARS HARMONIC ACE PLUS 45CM (MISCELLANEOUS) ×3 IMPLANT
SLEEVE ADV FIXATION 5X100MM (TROCAR) ×6 IMPLANT
SLEEVE GASTRECTOMY 36FR VISIGI (MISCELLANEOUS) ×3 IMPLANT
SOLUTION ANTI FOG 6CC (MISCELLANEOUS) ×3 IMPLANT
SPONGE LAP 18X18 X RAY DECT (DISPOSABLE) ×3 IMPLANT
STAPLER VISISTAT 35W (STAPLE) ×3 IMPLANT
SUT MNCRL AB 4-0 PS2 18 (SUTURE) ×3 IMPLANT
SUT SURGIDAC NAB ES-9 0 48 120 (SUTURE) IMPLANT
SUT VIC AB 4-0 SH 18 (SUTURE) ×3 IMPLANT
SUT VICRYL 0 TIES 12 18 (SUTURE) ×3 IMPLANT
SYR 10ML ECCENTRIC (SYRINGE) ×3 IMPLANT
SYR 20CC LL (SYRINGE) ×3 IMPLANT
SYR 50ML LL SCALE MARK (SYRINGE) ×3 IMPLANT
TOWEL OR 17X26 10 PK STRL BLUE (TOWEL DISPOSABLE) ×6 IMPLANT
TOWEL OR NON WOVEN STRL DISP B (DISPOSABLE) ×3 IMPLANT
TRAY FOLEY W/METER SILVER 16FR (SET/KITS/TRAYS/PACK) IMPLANT
TROCAR ADV FIXATION 5X100MM (TROCAR) ×3 IMPLANT
TROCAR BLADELESS 15MM (ENDOMECHANICALS) ×3 IMPLANT
TROCAR BLADELESS OPT 5 100 (ENDOMECHANICALS) ×3 IMPLANT
TROCAR BLADELESS OPT 5 150 (ENDOMECHANICALS) ×3 IMPLANT
TUBING CONNECTING 10 (TUBING) ×2 IMPLANT
TUBING CONNECTING 10' (TUBING) ×1
TUBING ENDO SMARTCAP (MISCELLANEOUS) ×3 IMPLANT
TUBING INSUF HEATED (TUBING) ×3 IMPLANT

## 2017-03-20 NOTE — Progress Notes (Signed)
Discussed post op day goals with patient including ambulation, IS, diet progression, pain, and nausea control.  Patient has begun bari clear fluids.  Patient directed on how to order tray from service response. Questions answered.

## 2017-03-20 NOTE — Progress Notes (Signed)
Dr Maple HudsonMoser aware of BP in pacu. No orders received. May tx to room at this time

## 2017-03-20 NOTE — Discharge Instructions (Signed)
° ° ° °GASTRIC BYPASS/SLEEVE ° Home Care Instructions ° ° These instructions are to help you care for yourself when you go home. ° °Call: If you have any problems. °• Call [phone] and ask for the surgeon on call °• If you need immediate help, come to the ER at [place].  °• Tell the ER staff that you are a new post-op gastric bypass or gastric sleeve patient °  °Signs and symptoms to report: • Severe vomiting or nausea °o If you cannot keep down clear liquids for longer than 1 day, call your surgeon  °• Abdominal pain that does not get better after taking your pain medication °• Fever over 100.4° F with chills °• Heart beating over 100 beats a minute °• Shortness of breath at rest °• Chest pain °•  Redness, swelling, drainage, or foul odor at incision (surgical) sites °•  If your incisions open or pull apart °• Swelling or pain in calf (lower leg) °• Diarrhea (Loose bowel movements that happen often), frequent watery, uncontrolled bowel movements °• Constipation, (no bowel movements for 3 days) if this happens: Pick one °o Milk of Magnesia, 2 tablespoons by mouth, 3 times a day for 2 days if needed °o Stop taking Milk of Magnesia once you have a bowel movement °o Call your doctor if constipation continues °Or °o Miralax  (instead of Milk of Magnesia) following the label instructions °o Stop taking Miralax once you have a bowel movement °o Call your doctor if constipation continues °• Anything you think is not normal °  °Normal side effects after surgery: • Unable to sleep at night or unable to focus °• Irritability or moody °• Being tearful (crying) or depressed °These are common complaints, possibly related to your anesthesia medications that put you to sleep, stress of surgery, and change in lifestyle.  This usually goes away a few weeks after surgery.  If these feelings continue, call your primary care doctor. °  °Wound Care: You may have surgical glue, steri-strips, or staples over your incisions after  surgery °• Surgical glue:  Looks like a clear film over your incisions and will wear off a little at a time °• Steri-strips: Strips of tape over your incisions. You may notice a yellowish color on the skin under the steri-strips. This is used to make the   steri-strips stick better. Do not pull the steri-strips off - let them fall off °• Staples: Staples may be removed before you leave the hospital °o If you go home with staples, call [facility], [phone] at for an appointment with your surgeon’s nurse to have staples removed 10 days after surgery. °• Showering: You may shower two (2) days after your surgery unless your surgeon tells you differently °o Wash gently around incisions with warm soapy water, rinse well, and gently pat dry  °o No tub baths until staples are removed, steri-strips fall off or glue is gone.  °  °Medications: • Medications should be liquid or crushed if larger than the size of a dime °• Extended release pills (medication that release a little bit at a time through the day) should NOT be crushed or cut. (examples include XL, ER, DR, SR) °• Depending on the size and number of medications you take, you may need to space (take a few throughout the day)/change the time you take your medications so that you do not over-fill your pouch (smaller stomach) °• Make sure you follow-up with your primary care doctor to   make medication changes needed during rapid weight loss and life-style changes °• If you have diabetes, follow up with the doctor that orders your diabetes medication(s) within one week after surgery and check your blood sugar regularly. °• Do not drive while taking prescription pain medication  °• It is ok to take Tylenol by the bottle instructions with your pain medicine or instead of your pain medicine as needed.  DO NOT TAKE NSAIDS (EXAMPLES OF NSAIDS:  IBUPROFREN/ NAPROXEN)  °Diet:                    First 2 Weeks ° You will see the dietician t about two (2) weeks  after your surgery. The dietician will increase the types of foods you can eat if you are handling liquids well: °• If you have severe vomiting or nausea and cannot keep down clear liquids lasting longer than 1 day, call your surgeon @ ([phone]) °Protein Shake °• Drink at least 2 ounces of shake 5-6 times per day °• Each serving of protein shakes (usually 8 - 12 ounces) should have: °o 15 grams of protein  °o And no more than 5 grams of carbohydrate  °• Goal for protein each day: °o Men = 80 grams per day °o Women = 60 grams per day °• Protein powder may be added to fluids such as non-fat milk or Lactaid milk or unsweetened Soy/Almond milk (limit to 35 grams added protein powder per serving) ° °Hydration °• Slowly increase the amount of water and other clear liquids as tolerated (See Acceptable Fluids) °• Slowly increase the amount of protein shake as tolerated  °•  Sip fluids slowly and throughout the day.  Do not use straws. °• May use sugar substitutes in small amounts (no more than 6 - 8 packets per day; i.e. Splenda) ° °Fluid Goal °• The first goal is to drink at least 8 ounces of protein shake/drink per day (or as directed by the nutritionist); some examples of protein shakes are Syntrax Nectar, Adkins Advantage, EAS Edge HP, and Unjury. See handout from pre-op Bariatric Education Class: °o Slowly increase the amount of protein shake you drink as tolerated °o You may find it easier to slowly sip shakes throughout the day °o It is important to get your proteins in first °• Your fluid goal is to drink 64 - 100 ounces of fluid daily °o It may take a few weeks to build up to this °• 32 oz (or more) should be clear liquids  °And  °• 32 oz (or more) should be full liquids (see below for examples) °• Liquids should not contain sugar, caffeine, or carbonation ° °Clear Liquids: °• Water or Sugar-free flavored water (i.e. Fruit H2O, Propel) °• Decaffeinated coffee or tea (sugar-free) °• Crystal Lite, Wyler’s Lite,  Minute Maid Lite °• Sugar-free Jell-O °• Bouillon or broth °• Sugar-free Popsicle:   *Less than 20 calories each; Limit 1 per day ° °Full Liquids: °Protein Shakes/Drinks + 2 choices per day of other full liquids °• Full liquids must be: °o No More Than 15 grams of Carbs per serving  °o No More Than 3 grams of Fat per serving °• Strained low-fat cream soup (except Cream of Potato or Tomato) °• Non-Fat milk °• Fat-free Lactaid Milk °• Unsweetened Soy Or Unsweetened Almond Milk °• Low Sugar yogurt (Dannon Lite & Fit, Greek yogurt; Oikos Triple Zero; Chobani Simply 100; Yoplait 100 calorie Greek - No Fruit on the Bottom) ° °  °Vitamins   and Minerals • Start 1 day after surgery unless otherwise directed by your surgeon °• 2 Chewable Bariatric Specific Multivitamin / Multimineral Supplement with iron (Example: Bariatric Advantage Multi EA) °• Chewable Calcium with Vitamin D-3 °(Example: 3 Chewable Calcium Plus 600 with Vitamin D-3) °o Take 500 mg three (3) times a day for a total of 1500 mg each day °o Do not take all 3 doses of calcium at one time as it may cause constipation, and you can only absorb 500 mg  at a time  °o Do not mix multivitamins containing iron with calcium supplements; take 2 hours apart °• Menstruating women and those with a history of anemia (a blood disease that causes weakness) may need extra iron °o Talk with your doctor to see if you need more iron °• Do not stop taking or change any vitamins or minerals until you talk to your dietitian or surgeon °• Your Dietitian and/or surgeon must approve all vitamin and mineral supplements °  °Activity and Exercise: Limit your physical activity as instructed by your doctor.  It is important to continue walking at home.  During this time, use these guidelines: °• Do not lift anything greater than ten (10) pounds for at least two (2) weeks °• Do not go back to work or drive until your surgeon says you can °• You may have sex when you feel comfortable  °o It is  VERY important for female patients to use a reliable birth control method; fertility often increases after surgery  °o All hormonal birth control will be ineffective for 30 days after surgery due to medications given during surgery a barrier method must be used. °o Do not get pregnant for at least 18 months °• Start exercising as soon as your doctor tells you that you can °o Make sure your doctor approves any physical activity °• Start with a simple walking program °• Walk 5-15 minutes each day, 7 days per week.  °• Slowly increase until you are walking 30-45 minutes per day °Consider joining our BELT program. [phone] or email [email or web address].[email or web address] °  °Special Instructions Things to remember: °• Use your CPAP when sleeping if this applies to you ° °• [facility] has two free Bariatric Surgery Support Groups that meet monthly °o The 3rd Thursday of each month, 6 pm, [facility]  °o The 2nd Friday of each month, 11:45 am in the private dining room in the basement of [place] °• It is very important to keep all follow up appointments with your surgeon, dietitian, primary care physician, and behavioral health practitioner °• Routine follow up schedule with your surgeon include appointments at 2-3 weeks, 6-8 weeks, 6 months, and 1 year at a minimum.  Your surgeon may request to see you more often.   °o After the first year, please follow up with your bariatric surgeon and dietitian at least once a year in order to maintain best weight loss results °[facility]: [phone] °[facility]: [phone] °Bariatric Nurse Coordinator: [phone] °  °   Reviewed and Endorsed  °by [facility] Patient Education Committee, June, 2016 °Edits Approved: Aug, 2018 ° ° ° °

## 2017-03-20 NOTE — Interval H&P Note (Signed)
History and Physical Interval Note:  03/20/2017 7:26 AM  Ashley Miller  has presented today for surgery, with the diagnosis of MORBID OBESITY  The various methods of treatment have been discussed with the patient and family. After consideration of risks, benefits and other options for treatment, the patient has consented to  Procedure(s): LAPAROSCOPIC GASTRIC SLEEVE RESECTION WITH UPPER ENDO (N/A) as a surgical intervention .  The patient's history has been reviewed, patient examined, no change in status, stable for surgery.  I have reviewed the patient's chart and labs.  Questions were answered to the patient's satisfaction.     Valarie MerinoMatthew B Eugune Sine

## 2017-03-20 NOTE — Transfer of Care (Signed)
Immediate Anesthesia Transfer of Care Note  Patient: Ashley Miller  Procedure(s) Performed: LAPAROSCOPIC GASTRIC SLEEVE RESECTION WITH UPPER ENDO (N/A )  Patient Location: PACU  Anesthesia Type:General  Level of Consciousness: awake and drowsy  Airway & Oxygen Therapy: Patient Spontanous Breathing and Patient connected to face mask oxygen  Post-op Assessment: Report given to RN and Post -op Vital signs reviewed and stable  Post vital signs: Reviewed and stable  Last Vitals:  Vitals:   03/20/17 0543  BP: (!) 160/103  Pulse: 100  Resp: 20  Temp: 37.4 C  SpO2: 99%    Last Pain:  Vitals:   03/20/17 0543  TempSrc: Oral      Patients Stated Pain Goal: 4 (03/20/17 0617)  Complications: No apparent anesthesia complications

## 2017-03-20 NOTE — Op Note (Signed)
Surgeon: Wenda LowMatt Shabria Egley, MD, FACS  Asst:  Phylliss Blakeshelsea Connor, MD  Anes:  General endotracheal  Procedure: Laparoscopic sleeve gastrectomy and upper endoscopy  Diagnosis: Morbid obesity  Complications: None noted  EBL:   5 cc  Description of Procedure:  The patient was take to OR 1 and given general anesthesia.  The abdomen was prepped with Technicare and draped sterilely.  A timeout was performed.  Access to the abdomen was achieved with a 5 mm Optiview through the left upper quadrant.  The patient's size was a factor in trocar placement and her small rib cage effected our trocar positioning.  The Airseal was placed in the right upper quadrant and the 15 was placed to the right of the midline.  Following insufflation, the state of the abdomen was found to be free of adhesions.  The ViSiGi 36Fr tube was inserted to deflate the stomach and was pulled back into the esophagus.    The pylorus was identified and we measured 5 cm back and marked the antrum.  At that point we began dissection to take down the greater curvature of the stomach using the Harmonic scalpel.  This dissection was taken all the way up to the left crus.  Posterior attachments of the stomach were also taken down.    The ViSiGi tube was then passed into the antrum and suction applied so that it was snug along the lessor curvature.  The "crow's foot" or incisura was identified.  The sleeve gastrectomy was begun using the [employer] InternationalCovidien platform stapler beginning with a 4.5 black load with TRS and then a 6 cm black load with TRS.  The sleeve was completed with 6 cm purple loads with TRS.  When the sleeve was complete the tube was taken off suction and insufflated briefly.  The tube was withdrawn.  Upper endoscopy was then performed by Dr. Fredricka Bonineonnor and no bubbles were seen.      The specimen was extracted through the 15 trocar site.  Wounds were infiltrated with Exparel and closed with 4-0 Monocryl.  The 15 mm site was closed with a single 0  vicryl.  Dermabond on the skin.  Susy Frizzle.    Matt B. Daphine DeutscherMartin, MD, [facility], [place] [phone]

## 2017-03-20 NOTE — Anesthesia Procedure Notes (Signed)
Procedure Name: Intubation Date/Time: 03/20/2017 7:39 AM Performed by: [place] Danny Zimny C, CRNA Pre-anesthesia Checklist: Patient identified, Emergency Drugs available, Suction available and Patient being monitored Patient Re-evaluated:Patient Re-evaluated prior to induction Oxygen Delivery Method: Circle system utilized Preoxygenation: Pre-oxygenation with 100% oxygen Induction Type: IV induction Ventilation: Mask ventilation without difficulty Laryngoscope Size: Mac and 3 Grade View: Grade II Tube type: Oral Tube size: 7.0 mm Number of attempts: 1 Airway Equipment and Method: Stylet and Oral airway Placement Confirmation: ETT inserted through vocal cords under direct vision,  positive ETCO2 and breath sounds checked- equal and bilateral Tube secured with: Tape Dental Injury: Teeth and Oropharynx as per pre-operative assessment

## 2017-03-20 NOTE — Anesthesia Preprocedure Evaluation (Signed)
Anesthesia Evaluation  Patient identified by MRN, date of birth, ID band Patient awake    Reviewed: Allergy & Precautions, NPO status , Patient's Chart, lab work & pertinent test results  History of Anesthesia Complications Negative for: history of anesthetic complications  Airway Mallampati: II  TM Distance: >3 FB Neck ROM: Full    Dental  (+) Teeth Intact   Pulmonary neg pulmonary ROS,    breath sounds clear to auscultation       Cardiovascular hypertension, Pt. on medications (-) angina(-) Past MI and (-) CHF  Rhythm:Regular     Neuro/Psych negative neurological ROS  negative psych ROS   GI/Hepatic negative GI ROS, Neg liver ROS,   Endo/Other  Morbid obesity  Renal/GU negative Renal ROS     Musculoskeletal negative musculoskeletal ROS (+)   Abdominal   Peds  Hematology negative hematology ROS (+)   Anesthesia Other Findings   Reproductive/Obstetrics                             Anesthesia Physical Anesthesia Plan  ASA: III  Anesthesia Plan: General   Post-op Pain Management:    Induction: Intravenous  PONV Risk Score and Plan: 3 and Ondansetron and Dexamethasone  Airway Management Planned: Oral ETT  Additional Equipment: None  Intra-op Plan:   Post-operative Plan: Extubation in OR  Informed Consent: I have reviewed the patients History and Physical, chart, labs and discussed the procedure including the risks, benefits and alternatives for the proposed anesthesia with the patient or authorized representative who has indicated his/her understanding and acceptance.   Dental advisory given  Plan Discussed with: CRNA and Surgeon  Anesthesia Plan Comments:         Anesthesia Quick Evaluation

## 2017-03-20 NOTE — Op Note (Signed)
Preoperative diagnosis: laparoscopic sleeve gastrectomy  Postoperative diagnosis: Same   Procedure: Upper endoscopy   Surgeon: Berna Buehelsea A Jazmine Longshore, M.D.  Anesthesia: Gen.   Indications for procedure: This patient was undergoing a laparoscopic sleeve gastrectomy.   Description of procedure: The endoscopy was placed in the mouth and into the oropharynx and under endoscopic vision it was advanced to the esophagogastric junction. The pouch was insufflated and no bleeding or bubbles were seen. The GEJ was identified at 40cm from the teeth. No undue narrowing or angulation of the lumen was present. No bleeding or leaks were detected. The scope was withdrawn without difficulty.   Berna Buehelsea A Kylee Nardozzi, M.D. General, Bariatric, & Minimally Invasive Surgery [facility]

## 2017-03-21 LAB — CBC WITH DIFFERENTIAL/PLATELET
BASOS ABS: 0 10*3/uL (ref 0.0–0.1)
Basophils Relative: 0 %
EOS PCT: 0 %
Eosinophils Absolute: 0 10*3/uL (ref 0.0–0.7)
HCT: 33.1 % — ABNORMAL LOW (ref 36.0–46.0)
Hemoglobin: 10.5 g/dL — ABNORMAL LOW (ref 12.0–15.0)
LYMPHS PCT: 13 %
Lymphs Abs: 1.6 10*3/uL (ref 0.7–4.0)
MCH: 25.6 pg — ABNORMAL LOW (ref 26.0–34.0)
MCHC: 31.7 g/dL (ref 30.0–36.0)
MCV: 80.7 fL (ref 78.0–100.0)
Monocytes Absolute: 0.9 10*3/uL (ref 0.1–1.0)
Monocytes Relative: 7 %
Neutro Abs: 9.7 10*3/uL — ABNORMAL HIGH (ref 1.7–7.7)
Neutrophils Relative %: 80 %
PLATELETS: 374 10*3/uL (ref 150–400)
RBC: 4.1 MIL/uL (ref 3.87–5.11)
RDW: 16.2 % — ABNORMAL HIGH (ref 11.5–15.5)
WBC: 12.2 10*3/uL — AB (ref 4.0–10.5)

## 2017-03-21 MED FILL — oxyCODONE HCL 5 MG/5ML SOLN: 5 | 2 days supply | Qty: 120 | Fill #0

## 2017-03-21 NOTE — Plan of Care (Signed)
Nutrition Education Note  Received consult for diet education per DROP protocol.   Discussed 2 week post op diet with pt. Emphasized that liquids must be non carbonated, non caffeinated, and sugar free. Fluid goals discussed. Pt to follow up with outpatient bariatric RD for further diet progression after 2 weeks. Multivitamins and minerals also reviewed. Teach back method used, pt expressed understanding, expect good compliance.   Diet: First 2 Weeks  You will see the nutritionist about two (2) weeks after your surgery. The nutritionist will increase the types of foods you can eat if you are handling liquids well:  If you have severe vomiting or nausea and cannot handle clear liquids lasting longer than 1 day, call your surgeon  Protein Shake  Drink at least 2 ounces of shake 5-6 times per day  Each serving of protein shakes (usually 8 - 12 ounces) should have a minimum of:  15 grams of protein  And no more than 5 grams of carbohydrate  Goal for protein each day:  Men = 80 grams per day  Women = 60 grams per day  Protein powder may be added to fluids such as non-fat milk or Lactaid milk or Soy milk (limit to 35 grams added protein powder per serving)   Hydration  Slowly increase the amount of water and other clear liquids as tolerated (See Acceptable Fluids)  Slowly increase the amount of protein shake as tolerated  Sip fluids slowly and throughout the day  May use sugar substitutes in small amounts (no more than 6 - 8 packets per day; i.e. Splenda)   Fluid Goal  The first goal is to drink at least 8 ounces of protein shake/drink per day (or as directed by the nutritionist); some examples of protein shakes are Premier Protein, Syntrax Nectar, Adkins Advantage, EAS Edge HP, and Unjury. See handout from pre-op Bariatric Education Class:  Slowly increase the amount of protein shake you drink as tolerated  You may find it easier to slowly sip shakes throughout the day  It is important to  get your proteins in first  Your fluid goal is to drink 64 - 100 ounces of fluid daily  It may take a few weeks to build up to this  32 oz (or more) should be clear liquids  And  32 oz (or more) should be full liquids (see below for examples)  Liquids should not contain sugar, caffeine, or carbonation   Clear Liquids:  Water or Sugar-free flavored water (i.e. Fruit H2O, Propel)  Decaffeinated coffee or tea (sugar-free)  Crystal Lite, Wyler?s Lite, Minute Maid Lite  Sugar-free Jell-O  Bouillon or broth  Sugar-free Popsicle: *Less than 20 calories each; Limit 1 per day   Full Liquids:  Protein Shakes/Drinks + 2 choices per day of other full liquids  Full liquids must be:  No More Than 12 grams of Carbs per serving  No More Than 3 grams of Fat per serving  Strained low-fat cream soup  Non-Fat milk  Fat-free Lactaid Milk  Sugar-free yogurt (Dannon Lite & Fit, Greek yogurt, Oikos Zero)   Lisbet Busker, MS, RD, LDN [place] [facility] Clinical Dietitian Pager: [phone] After Hours Pager: [phone]   

## 2017-03-21 NOTE — Progress Notes (Signed)
Patient alert and oriented, Post op day 1.  Provided support and encouragement.  Encouraged pulmonary toilet, ambulation and small sips of liquids.  Patient has completed 12 ounces of bariatric clear fluids and began protein shakes.  All questions answered.  Will continue to monitor.

## 2017-03-21 NOTE — Progress Notes (Signed)
Discharge instructions discussed with patient and family, verbalized agreement and understanding, 

## 2017-03-21 NOTE — Progress Notes (Signed)
Patient alert and oriented, pain is controlled. Patient is tolerating fluids, advanced to protein shake today, patient is tolerating well.  Reviewed Gastric sleeve discharge instructions with patient and patient is able to articulate understanding.  Provided information on BELT program, Support Group and [facility] outpatient pharmacy. All questions answered, will continue to monitor.  

## 2017-03-21 NOTE — Progress Notes (Incomplete)
Paged Dr Gerrit FriendsGerkin about pts BP 164/105. Systolic BP does not meet specifications for hydralazine prn (>180). NO specifications for diastolic.

## 2017-03-21 NOTE — Discharge Summary (Signed)
Physician Discharge Summary  Patient ID: Ashley Miller MRN: [id number] DOB/AGE: 02-13-1980 37 y.o.  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  She was begun on liquids and advanced and ready for discharge on Wednesday.    Consults: none  Significant Diagnostic Studies: none    Discharge Exam: Blood pressure (!) 160/99, pulse 84, temperature 99 F (37.2 C), temperature source Oral, resp. rate 18, height 5\' 11"  (1.803 m), weight (!) 191.4 kg (422 lb), last menstrual period 03/19/2017, SpO2 100 %. Incisions ok.    Disposition: 01-Home or Self Care  Discharge Instructions    Ambulate hourly while awake   Complete by:  As directed    Call MD for:  difficulty breathing, headache or visual disturbances   Complete by:  As directed    Call MD for:  persistant dizziness or light-headedness   Complete by:  As directed    Call MD for:  persistant nausea and vomiting   Complete by:  As directed    Call MD for:  redness, tenderness, or signs of infection (pain, swelling, redness, odor or green/yellow discharge around incision site)   Complete by:  As directed    Call MD for:  severe uncontrolled pain   Complete by:  As directed    Call MD for:  temperature >101 F   Complete by:  As directed    Diet bariatric full liquid   Complete by:  As directed    Incentive spirometry   Complete by:  As directed    Perform hourly while awake     Allergies as of 03/21/2017      Reactions   Penicillins Nausea And Vomiting, Other (See Comments)   Yeast infection Has patient had a PCN reaction causing immediate rash, facial/tongue/throat swelling, SOB or lightheadedness with hypotension: Yes Has patient  had a PCN reaction causing severe rash involving mucus membranes or skin necrosis: No Has patient had a PCN reaction that required hospitalization: No Has patient had a PCN reaction occurring within the last 10 years: No If all of the above answers are "NO", then may proceed with Cephalosporin use.      Medication List    STOP taking these medications   megestrol 40 MG tablet Commonly known as:  MEGACE     TAKE these medications   amLODipine-valsartan 10-320 MG tablet Commonly known as:  EXFORGE Take 1 tablet by mouth daily. Notes to patient:  Monitor Blood Pressure Daily and keep a log for primary care physician.  You may need to make changes to your medications with rapid weight loss.     CENTRUM MULTIGUMMIES PO Take 1 each by mouth daily.   diphenhydrAMINE 25 MG tablet Commonly known as:  BENADRYL Take 1 tablet (25 mg total) by mouth every 6 (six) hours.   ibuprofen 800 MG tablet Commonly known as:  ADVIL,MOTRIN Take 800 mg by mouth every 8 (eight) hours as needed for headache or moderate pain. Notes to patient:  Avoid NSAIDs for 6-8 weeks after surgery   MUCINEX FAST-MAX COLD FLU PO Take 20 mLs by mouth at bedtime as needed (for congestion).   VICKS VAPORUB EX Apply 1 application topically at bedtime as needed (for congestion).  Follow-up Information    [facility], [facility]. Go on 04/04/2017.   Specialty:  General Surgery Why:  at 915.  Appointment with Dr Luretha Murphy Contact information: [address] [place] [address] [phone]        Luretha Murphy, MD Follow up.   Specialty:  General Surgery Contact information: [address] [place] [address] [phone]           Signed: Valarie Merino 03/21/2017, 1:01 PM

## 2017-03-21 NOTE — Anesthesia Postprocedure Evaluation (Signed)
Anesthesia Post Note  Patient: Ashley Miller  Procedure(s) Performed: LAPAROSCOPIC GASTRIC SLEEVE RESECTION WITH UPPER ENDO (N/A )     Patient location during evaluation: PACU Anesthesia Type: General Level of consciousness: awake and alert Pain management: pain level controlled Vital Signs Assessment: post-procedure vital signs reviewed and stable Respiratory status: spontaneous breathing, nonlabored ventilation, respiratory function stable and patient connected to nasal cannula oxygen Cardiovascular status: blood pressure returned to baseline and stable Postop Assessment: no apparent nausea or vomiting Anesthetic complications: no    Last Vitals:  Vitals:   03/21/17 1000 03/21/17 1400  BP: (!) 160/99 (!) 164/95  Pulse: 84 82  Resp: 18 18  Temp: 37.2 C 36.7 C  SpO2: 100% 98%    Last Pain:  Vitals:   03/21/17 1400  TempSrc: Oral  PainSc:                  [name]

## 2017-03-26 ENCOUNTER — Telehealth (HOSPITAL_COMMUNITY): Payer: Self-pay

## 2017-03-26 NOTE — Telephone Encounter (Signed)
Patient called to discuss post bariatric surgery follow up questions.  See below:   1.  Tell me about your pain and pain management?pattient reports no pain postop  2.  Let's talk about fluid intake.  How much total fluid are you taking in?42+ ounces of fluids per day 3.  How much protein have you taken in the last 2 days?45 grams of protein  4.  Have you had nausea?  Tell me about when have experienced nausea and what you did to help?patient has not experienced nausea  5.  Has the frequency or color changed with your urine?urine the color of apple juice, encouraged to continue to increase fluids to 64 ounces utilizing Phase II diet approved fluids  6.  Tell me what your incisions look like?glue intact no problems  7.  Have you been passing gas? BM?several bm's one today  8.  If a problem or question were to arise who would you call?  Do you know contact numbers for [facility], [facility], and [facility]?patient is aware of services and numbers  9.  How has the walking going?walking in home due to rain and wind, plans to venture outside today  10.  How are your vitamins and calcium going?  How are you taking them?cannot tolerate celebrate.  We discussed other options.  Patient would like to try [employer] MVI, patient also would like to use a calcium other than TUMS and plans to look at options to day.

## 2017-03-29 DIAGNOSIS — I119 Hypertensive heart disease without heart failure: Secondary | ICD-10-CM | POA: Diagnosis not present

## 2017-03-29 DIAGNOSIS — I10 Essential (primary) hypertension: Secondary | ICD-10-CM | POA: Diagnosis not present

## 2017-03-29 DIAGNOSIS — E559 Vitamin D deficiency, unspecified: Secondary | ICD-10-CM | POA: Diagnosis not present

## 2017-03-29 DIAGNOSIS — K219 Gastro-esophageal reflux disease without esophagitis: Secondary | ICD-10-CM | POA: Diagnosis not present

## 2017-04-03 ENCOUNTER — Encounter: Payer: BLUE CROSS/BLUE SHIELD | Attending: Surgery | Admitting: Registered"

## 2017-04-03 DIAGNOSIS — E669 Obesity, unspecified: Secondary | ICD-10-CM

## 2017-04-03 DIAGNOSIS — Z713 Dietary counseling and surveillance: Secondary | ICD-10-CM | POA: Diagnosis not present

## 2017-04-03 DIAGNOSIS — Z6841 Body Mass Index (BMI) 40.0 and over, adult: Secondary | ICD-10-CM | POA: Insufficient documentation

## 2017-04-04 NOTE — Progress Notes (Signed)
Bariatric Class:  Appt start time: 1530 end time:  1630.  2 Week Post-Operative Nutrition Class  Patient was seen on 04/03/2017 for Post-Operative Nutrition education [facility].   Surgery date: 03/20/2017 Surgery type: Sleeve Start weight at [facility]: 402.4 Weight today: 381.4 Weight change: 21 lbs loss  Average daily fluid intake: protein shakes + water (16 oz) Average daily protein intake: ~60 grams  Pt states she has experienced a little mood change and abdominal pain. Pt states she experienced stomach spasms shortly after having surgery when drinking and that has caused her to fear introducing food. Pt states she is ready to eat though. Pt states she is taking [employer] chewable multivitamin and 3 calcium supplements daily. Pt states she does not tolerate MVI well in the morning. RD encouraged pt to try crushing MVI and/or taking later in the day. Pt was also encouraged to continue increasing fluid intake especially with diet advancing to soft protein foods.   TANITA  BODY COMP RESULTS  04/03/2017   BMI (kg/m^2) 53.2   Fat Mass (lbs) 236.0   Fat Free Mass (lbs) 145.4   Total Body Water (lbs) N/A   The following the learning objectives were met by the patient during this course:  Identifies Phase 3A (Soft, High Proteins) Dietary Goals and will begin from 2 weeks post-operatively to 2 months post-operatively  Identifies appropriate sources of fluids and proteins   States protein recommendations and appropriate sources post-operatively  Identifies the need for appropriate texture modifications, mastication, and bite sizes when consuming solids  Identifies appropriate multivitamin and calcium sources post-operatively  Describes the need for physical activity post-operatively and will follow MD recommendations  States when to call healthcare provider regarding medication questions or post-operative complications  Handouts given during  class include:  Phase 3A: Soft, High Protein Diet Handout  Follow-Up Plan: Patient will follow-up at [facility] in 6 weeks for 2 month post-op nutrition visit for diet advancement per MD. RD will follow-up with patient related to fluid intake via phone on Fri, 3/8.

## 2017-04-06 ENCOUNTER — Telehealth: Payer: Self-pay | Admitting: Registered"

## 2017-04-06 NOTE — Telephone Encounter (Signed)
RD called pt to verify fluid intake since change from liquid only diet to soft high protein diet. Pt states her fluid intake has increased to ~35 ounces/day. Pt was encouraged to continue to increase. Pt states she is tolerating deli meat, cheese, and Malaw[place]urkey roll-ups well. Pt states she is only eating a small amount of fish and chicken at a time, expressed concern because it was such a small amount. Pt was encouraged to take it slow.

## 2017-04-12 DIAGNOSIS — N8502 Endometrial intraepithelial neoplasia [EIN]: Secondary | ICD-10-CM | POA: Diagnosis not present

## 2017-04-12 DIAGNOSIS — Z01812 Encounter for preprocedural laboratory examination: Secondary | ICD-10-CM | POA: Diagnosis not present

## 2017-04-17 ENCOUNTER — Encounter (HOSPITAL_COMMUNITY): Payer: Self-pay | Admitting: Emergency Medicine

## 2017-04-17 ENCOUNTER — Emergency Department (HOSPITAL_COMMUNITY): Payer: BLUE CROSS/BLUE SHIELD

## 2017-04-17 ENCOUNTER — Other Ambulatory Visit: Payer: Self-pay

## 2017-04-17 ENCOUNTER — Inpatient Hospital Stay (HOSPITAL_COMMUNITY)
Admission: EM | Admit: 2017-04-17 | Discharge: 2017-04-20 | DRG: 176 | Disposition: A | Discharge status: Home / Self Care | Payer: BLUE CROSS/BLUE SHIELD | Attending: Internal Medicine | Admitting: Internal Medicine

## 2017-04-17 DIAGNOSIS — D649 Anemia, unspecified: Secondary | ICD-10-CM | POA: Diagnosis present

## 2017-04-17 DIAGNOSIS — Z880 Allergy status to penicillin: Secondary | ICD-10-CM

## 2017-04-17 DIAGNOSIS — Z9884 Bariatric surgery status: Secondary | ICD-10-CM

## 2017-04-17 DIAGNOSIS — R079 Chest pain, unspecified: Secondary | ICD-10-CM | POA: Diagnosis not present

## 2017-04-17 DIAGNOSIS — I10 Essential (primary) hypertension: Secondary | ICD-10-CM | POA: Diagnosis present

## 2017-04-17 DIAGNOSIS — I824Z2 Acute embolism and thrombosis of unspecified deep veins of left distal lower extremity: Secondary | ICD-10-CM | POA: Diagnosis present

## 2017-04-17 DIAGNOSIS — I361 Nonrheumatic tricuspid (valve) insufficiency: Secondary | ICD-10-CM | POA: Diagnosis not present

## 2017-04-17 DIAGNOSIS — Z6841 Body Mass Index (BMI) 40.0 and over, adult: Secondary | ICD-10-CM | POA: Diagnosis not present

## 2017-04-17 DIAGNOSIS — Z9889 Other specified postprocedural states: Secondary | ICD-10-CM | POA: Diagnosis not present

## 2017-04-17 DIAGNOSIS — R0602 Shortness of breath: Secondary | ICD-10-CM | POA: Diagnosis not present

## 2017-04-17 DIAGNOSIS — Z79811 Long term (current) use of aromatase inhibitors: Secondary | ICD-10-CM | POA: Diagnosis not present

## 2017-04-17 DIAGNOSIS — N8502 Endometrial intraepithelial neoplasia [EIN]: Secondary | ICD-10-CM | POA: Diagnosis not present

## 2017-04-17 DIAGNOSIS — N920 Excessive and frequent menstruation with regular cycle: Secondary | ICD-10-CM | POA: Diagnosis present

## 2017-04-17 DIAGNOSIS — I2699 Other pulmonary embolism without acute cor pulmonale: Principal | ICD-10-CM | POA: Diagnosis present

## 2017-04-17 LAB — CBC WITH DIFFERENTIAL/PLATELET
BASOS ABS: 0.1 10*3/uL (ref 0.0–0.1)
Basophils Relative: 1 %
EOS ABS: 0.1 10*3/uL (ref 0.0–0.7)
Eosinophils Relative: 1 %
HCT: 33.2 % — ABNORMAL LOW (ref 36.0–46.0)
HEMOGLOBIN: 10.3 g/dL — AB (ref 12.0–15.0)
LYMPHS ABS: 2.5 10*3/uL (ref 0.7–4.0)
LYMPHS PCT: 28 %
MCH: 26.2 pg (ref 26.0–34.0)
MCHC: 31 g/dL (ref 30.0–36.0)
MCV: 84.5 fL (ref 78.0–100.0)
Monocytes Absolute: 0.7 10*3/uL (ref 0.1–1.0)
Monocytes Relative: 8 %
NEUTROS PCT: 62 %
Neutro Abs: 5.5 10*3/uL (ref 1.7–7.7)
Platelets: 349 10*3/uL (ref 150–400)
RBC: 3.93 MIL/uL (ref 3.87–5.11)
RDW: 15.2 % (ref 11.5–15.5)
WBC: 8.8 10*3/uL (ref 4.0–10.5)

## 2017-04-17 LAB — BASIC METABOLIC PANEL
Anion gap: 9 (ref 5–15)
BUN: 8 mg/dL (ref 6–20)
CHLORIDE: 107 mmol/L (ref 101–111)
CO2: 24 mmol/L (ref 22–32)
CREATININE: 0.92 mg/dL (ref 0.44–1.00)
Calcium: 9 mg/dL (ref 8.9–10.3)
GFR calc Af Amer: >60 mL/min (ref >60)
GFR calc non Af Amer: >60 mL/min (ref >60)
Glucose, Bld: 94 mg/dL (ref 65–99)
POTASSIUM: 3.7 mmol/L (ref 3.5–5.1)
Sodium: 140 mmol/L (ref 135–145)

## 2017-04-17 LAB — TROPONIN I: Troponin I: 0.07 ng/mL (ref <0.03)

## 2017-04-17 LAB — BRAIN NATRIURETIC PEPTIDE: B NATRIURETIC PEPTIDE 5: 32 pg/mL (ref 0.0–100.0)

## 2017-04-17 LAB — D-DIMER, QUANTITATIVE (NOT AT ARMC): D DIMER QUANT: 7.26 ug{FEU}/mL — AB (ref 0.00–0.50)

## 2017-04-17 LAB — PREGNANCY, URINE: PREG TEST UR: NEGATIVE

## 2017-04-17 MED ORDER — OXYCODONE HCL 5 MG/5ML PO SOLN: 5.0000 mg | Freq: Four times a day (QID) | ORAL | Status: DC | PRN

## 2017-04-17 MED ORDER — CENTRUM PO CHEW
1.0000 | CHEWABLE_TABLET | Freq: Every day | ORAL | Status: DC
  Administered 2017-04-18 – 2017-04-20 (×3): 1 via ORAL
  Filled 2017-04-17 (×3): qty 1

## 2017-04-17 MED ORDER — ACETAMINOPHEN 650 MG RE SUPP: 650.0000 mg | Freq: Four times a day (QID) | RECTAL | Status: DC | PRN

## 2017-04-17 MED ORDER — ONDANSETRON HCL 4 MG PO TABS: 4.0000 mg | ORAL_TABLET | Freq: Four times a day (QID) | ORAL | Status: DC | PRN

## 2017-04-17 MED ORDER — HEPARIN (PORCINE) IN NACL 100-0.45 UNIT/ML-% IJ SOLN
2000.0000 [IU]/h | INTRAMUSCULAR | Status: DC
  Administered 2017-04-17: 2000 [IU]/h via INTRAVENOUS
  Filled 2017-04-17 (×2): qty 250

## 2017-04-17 MED ORDER — ONDANSETRON HCL 4 MG/2ML IJ SOLN: 4.0000 mg | Freq: Four times a day (QID) | INTRAMUSCULAR | Status: DC | PRN

## 2017-04-17 MED ORDER — HEPARIN BOLUS VIA INFUSION
3000.0000 [IU] | Freq: Once | INTRAVENOUS | Status: AC
  Administered 2017-04-17: 3000 [IU] via INTRAVENOUS
  Filled 2017-04-17: qty 3000

## 2017-04-17 MED ORDER — HEPARIN BOLUS VIA INFUSION
4000.0000 [IU] | Freq: Once | INTRAVENOUS | Status: DC
  Filled 2017-04-17: qty 4000

## 2017-04-17 MED ORDER — MEGESTROL ACETATE 40 MG/ML PO SUSP
200.0000 mg | Freq: Two times a day (BID) | ORAL | Status: DC
  Filled 2017-04-17 (×4): qty 5

## 2017-04-17 MED ORDER — CALCIUM CARBONATE ANTACID 500 MG PO CHEW: 1.0000 | CHEWABLE_TABLET | Freq: Every day | ORAL | Status: DC | PRN

## 2017-04-17 MED ORDER — IOPAMIDOL (ISOVUE-370) INJECTION 76%
INTRAVENOUS | Status: AC
  Administered 2017-04-17: 100 mL
  Filled 2017-04-17: qty 100

## 2017-04-17 MED ORDER — ACETAMINOPHEN 325 MG PO TABS: 650.0000 mg | ORAL_TABLET | Freq: Four times a day (QID) | ORAL | Status: DC | PRN

## 2017-04-17 NOTE — H&P (Signed)
History and Physical    KIERNAN FARKAS Miller:[id number] DOB: 03-Feb-1980 DOA: 04/17/2017  PCP: Jackie Plum, MD  Patient coming from: Home  I have personally briefly reviewed patient's old medical records in [facility]  Chief Complaint: CP, SOB  HPI: Ashley Miller is a 37 y.o. female with medical history significant of HTN, obesity, complex endometrial hyperplasia being followed by Dr. Noland Fordyce at [facility] gyn-onc.  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  Patient states that she has been noticing increasing shortness of breath with exertion over the past 2 or 3 days.  Patient also has some chest discomfort, described as tightness.  There is no specific aggravating or relieving factors with her chest pain.  Patient denies any history of PE.  She also denies any history of heavy smoking or substance abuse and there is no premature CAD in the family.   ED Course: B PE without CT evidence of R heart strain see on CT scan.  Heparin gtt started.   Review of Systems: As per HPI otherwise 10 point review of systems negative.   Past Medical History:  Diagnosis Date  . Hypertension     Past Surgical History:  Procedure Laterality Date  . DILATION AND CURETTAGE OF UTERUS    . LAPAROSCOPIC GASTRIC SLEEVE RESECTION N/A 03/20/2017   Procedure: LAPAROSCOPIC GASTRIC SLEEVE RESECTION WITH UPPER ENDO;  Surgeon: Luretha Murphy, MD;  Location: [facility];  Service: General;  Laterality: N/A;     reports that  has never smoked. she has never used smokeless tobacco. She reports that she does not drink alcohol or use drugs.  Allergies  Allergen Reactions  . Penicillins Nausea And Vomiting and Other (See Comments)    Yeast infection Has patient had a PCN reaction causing immediate rash, facial/tongue/throat swelling, SOB or lightheadedness with hypotension: Yes Has patient had a PCN reaction causing severe rash involving mucus membranes or skin  necrosis: No Has patient had a PCN reaction that required hospitalization: No Has patient had a PCN reaction occurring within the last 10 years: No If all of the above answers are "NO", then may proceed with Cephalosporin use.     Family History  Problem Relation Age of Onset  . Diabetes Other   . Hypertension Other   . Heart disease Other      Prior to Admission medications   Medication Sig Start Date End Date Taking? Authorizing Provider  calcium carbonate (TUMS - DOSED IN MG ELEMENTAL CALCIUM) 500 MG chewable tablet Chew 1 tablet by mouth daily as needed for indigestion or heartburn.   Yes [provider]  Camphor-Eucalyptus-Menthol (VICKS VAPORUB EX) Apply 1 application topically at bedtime as needed (for congestion).   Yes [provider]  megestrol (MEGACE) 40 MG/ML suspension TAKE 5 ML BY MOUTH TWICE DAILY. 04/12/17  Yes [provider]  Multiple Vitamins-Minerals (CENTRUM MULTIGUMMIES PO) Take 1 each by mouth daily.   Yes [provider]  oxyCODONE (ROXICODONE) 5 MG/5ML solution Take 5 mg by mouth every 6 (six) hours as needed for moderate pain or severe pain.  03/21/17  Yes [provider]    Physical Exam: Vitals:   04/17/17 1607 04/17/17 1635 04/17/17 1635 04/17/17 1805  BP: (!) 174/90   (!) 149/93  Pulse: 83   83  Resp: (!) 21   13  Temp:      TempSrc:      SpO2: 100% 100%  100%  Weight:   Marland Kitchen)  171 kg (377 lb)   Height:   5\' 11"  (1.803 m)     Constitutional: NAD, calm, comfortable Eyes: PERRL, lids and conjunctivae normal ENMT: Mucous membranes are moist. Posterior pharynx clear of any exudate or lesions.Normal dentition.  Neck: normal, supple, no masses, no thyromegaly Respiratory: clear to auscultation bilaterally, no wheezing, no crackles. Normal respiratory effort. No accessory muscle use.  Cardiovascular: Regular rate and rhythm, no murmurs / rubs / gallops. No extremity edema. 2+ pedal pulses. No carotid bruits.    Abdomen: no tenderness, no masses palpated. No hepatosplenomegaly. Bowel sounds positive.  Musculoskeletal: no clubbing / cyanosis. No joint deformity upper and lower extremities. Good ROM, no contractures. Normal muscle tone.  Skin: no rashes, lesions, ulcers. No induration Neurologic: CN 2-12 grossly intact. Sensation intact, DTR normal. Strength 5/5 in all 4.  Psychiatric: Normal judgment and insight. Alert and oriented x 3. Normal mood.    Labs on Admission: I have personally reviewed following labs and imaging studies  CBC: Recent Labs  Lab 04/17/17 1639  WBC 8.8  NEUTROABS 5.5  HGB 10.3*  HCT 33.2*  MCV 84.5  PLT 349   Basic Metabolic Panel: Recent Labs  Lab 04/17/17 1639  NA 140  K 3.7  CL 107  CO2 24  GLUCOSE 94  BUN 8  CREATININE 0.92  CALCIUM 9.0   GFR: Estimated Creatinine Clearance: 145.2 mL/min (by C-G formula based on SCr of 0.92 mg/dL). Liver Function Tests: No results for input(s): AST, ALT, ALKPHOS, BILITOT, PROT, ALBUMIN in the last 168 hours. No results for input(s): LIPASE, AMYLASE in the last 168 hours. No results for input(s): AMMONIA in the last 168 hours. Coagulation Profile: No results for input(s): INR, PROTIME in the last 168 hours. Cardiac Enzymes: Recent Labs  Lab 04/17/17 1639  TROPONINI 0.07*   BNP (last 3 results) No results for input(s): PROBNP in the last 8760 hours. HbA1C: No results for input(s): HGBA1C in the last 72 hours. CBG: No results for input(s): GLUCAP in the last 168 hours. Lipid Profile: No results for input(s): CHOL, HDL, LDLCALC, TRIG, CHOLHDL, LDLDIRECT in the last 72 hours. Thyroid Function Tests: No results for input(s): TSH, T4TOTAL, FREET4, T3FREE, THYROIDAB in the last 72 hours. Anemia Panel: No results for input(s): VITAMINB12, FOLATE, FERRITIN, TIBC, IRON, RETICCTPCT in the last 72 hours. Urine analysis: No results found for: COLORURINE, APPEARANCEUR, LABSPEC, PHURINE, GLUCOSEU, HGBUR, BILIRUBINUR,  KETONESUR, PROTEINUR, UROBILINOGEN, NITRITE, LEUKOCYTESUR  Radiological Exams on Admission: Dg Chest 2 View  Result Date: 04/17/2017 CLINICAL DATA:  Chest pain and tightness with shortness of breath. EXAM: CHEST - 2 VIEW COMPARISON:  Chest x-ray dated April 26, 2016. FINDINGS: The heart size and mediastinal contours are within normal limits. Both lungs are clear. The visualized skeletal structures are unremarkable. IMPRESSION: No active cardiopulmonary disease. Electronically Signed   By: Obie Dredge M.D.   On: 04/17/2017 15:36   Ct Angio Chest Pe W And/or Wo Contrast  Result Date: 04/17/2017 CLINICAL DATA:  Chest pain and tightness with dyspnea since Sunday after walk. One month postop for gastric sleeve procedure. EXAM: CT ANGIOGRAPHY CHEST WITH CONTRAST TECHNIQUE: Multidetector CT imaging of the chest was performed using the standard protocol during bolus administration of intravenous contrast. Multiplanar CT image reconstructions and MIPs were obtained to evaluate the vascular anatomy. CONTRAST:  ISOVUE-370 IOPAMIDOL (ISOVUE-370) INJECTION 76% COMPARISON:  CXR 04/17/2017 FINDINGS: Cardiovascular: The study is of quality for the evaluation of pulmonary embolism. Acute bilateral pulmonary emboli noted at the  branch point of the right main pulmonary artery extending into all lobes as well as to the left lower lobe and lingula. RV/LV ratio is 0.67. Great vessels are normal in course and caliber. Normal heart size. No significant pericardial fluid/thickening. Mediastinum/Nodes: No discrete thyroid nodules. Unremarkable esophagus. No pathologically enlarged axillary, mediastinal or hilar lymph nodes. Lungs/Pleura: No pneumothorax. No pleural effusion. Lungs are clear Upper abdomen: Staple line across the stomach from gastric sleeve procedure. No upper abdominal abnormality. Musculoskeletal:  No aggressive appearing focal osseous lesions. Review of the MIP images confirms the above findings.  IMPRESSION: Positive for acute bilateral multilobar PE without CT evidence of right heart strain (RV/LV Ratio = 0.67). These results were called by telephone at the time of interpretation on 04/17/2017 at 7:05 pm to Dr. Derwood KaplanANKIT NANAVATI , who verbally acknowledged these results. Electronically Signed   By: Tollie Ethavid  Kwon M.D.   On: 04/17/2017 19:05    EKG: Independently reviewed.  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 Bilateral PE - 1. Heparin gtt 2. 2d echo 3. BLE US 2. Complex atypical endometrial hyperplasia - 1. Continue megace - non estrogen containing 2. Following with Dr. Noland FordyceLentz  DVT prophylaxis: heparin gtt Code Status: Full Family Communication: Family at bedside Disposition Plan: Home after admit Consults called: None [place]   Shian Goodnow, Heywood IlesJARED M. DO [facility] Pager [phone]  If 7AM-7PM, please contact day team taking care of patient [email or web address] Password [email or web address]  04/17/2017, 8:04 PM

## 2017-04-17 NOTE — ED Provider Notes (Addendum)
[facility]-EMERGENCY DEPT Provider Note   CSN: [id number] Arrival date & time: 04/17/17  1419     History   Chief Complaint Chief Complaint  Patient presents with  . Chest Pain  . Shortness of Breath    HPI Ashley Miller is a 37 y.o. female.  HPI 37 year old female comes in with chief complaint of chest pain. 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  Patient was also on oral contraceptives, which were restarted earlier this week.  Patient states that she has been noticing increasing shortness of breath with exertion over the past 2 or 3 days.  Patient also has some chest discomfort, described as tightness.  There is no specific aggravating or relieving factors with her chest pain.  Patient denies any history of PE.  She also denies any history of heavy smoking or substance abuse and there is no premature CAD in the family.   Past Medical History:  Diagnosis Date  . Hypertension     Patient Active Problem List   Diagnosis Date Noted  . 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 DILATION AND CURETTAGE OF UTERUS    . LAPAROSCOPIC GASTRIC SLEEVE RESECTION N/A 03/20/2017   Procedure: LAPAROSCOPIC GASTRIC SLEEVE RESECTION WITH UPPER ENDO;  Surgeon: Luretha MurphyMartin, Matthew, MD;  Location: [facility] ORS;  Service: General;  Laterality: N/A;    OB History    No data available       Home Medications    Prior to Admission medications   Medication Sig Start Date End Date Taking? Authorizing Provider  calcium carbonate (TUMS - DOSED IN MG ELEMENTAL CALCIUM) 500 MG chewable tablet Chew 1 tablet by mouth daily as needed for indigestion or heartburn.   Yes [provider]  Camphor-Eucalyptus-Menthol (VICKS VAPORUB EX) Apply 1 application topically at bedtime as needed (for congestion).   Yes [provider]  megestrol (MEGACE) 40 MG/ML  suspension TAKE 5 ML BY MOUTH TWICE DAILY. 04/12/17  Yes [provider]  Multiple Vitamins-Minerals (CENTRUM MULTIGUMMIES PO) Take 1 each by mouth daily.   Yes [provider]  oxyCODONE (ROXICODONE) 5 MG/5ML solution Take 5 mg by mouth every 6 (six) hours as needed for moderate pain or severe pain.  03/21/17  Yes [provider]  diphenhydrAMINE (BENADRYL) 25 MG tablet Take 1 tablet (25 mg total) by mouth every 6 (six) hours. Patient not taking: Reported on 05/24/2016 10/31/14   Gwyneth SproutPlunkett, Whitney, MD    Family History Family History  Problem Relation Age of Onset  . Diabetes Other   . Hypertension Other   . Heart disease Other     Social History Social History   Tobacco Use  . Smoking status: Never Smoker  . Smokeless tobacco: Never Used  Substance Use Topics  . Alcohol use: No  . Drug use: No     Allergies   Penicillins   Review of Systems Review of Systems  Constitutional: Positive for activity change.  Respiratory: Positive for shortness of breath.   Cardiovascular: Positive for chest pain.  Hematological: Does not bruise/bleed easily.  All other systems reviewed and are negative.    Physical Exam Updated Vital Signs BP (!) 149/93 (BP Location: Right Arm)   Pulse 83   Temp 98.1 F (36.7 C) (Oral)   Resp 13   Ht 5\' 11"  (1.803 m)   Wt (!) 171 kg (377 lb)  LMP 03/19/2017 Comment: spotting 03/19/17  SpO2 100%   BMI 52.58 kg/m   Physical Exam  Constitutional: She is oriented to person, place, and time. She appears well-developed. She appears distressed.  Mildly distressed when talking in long sentences.  HENT:  Head: Normocephalic and atraumatic.  Eyes: EOM are normal.  Neck: Normal range of motion. Neck supple.  Cardiovascular: Normal rate, intact distal pulses and normal pulses.  Pulmonary/Chest: No accessory muscle usage. Tachypnea noted. She is in respiratory distress. She has no decreased breath sounds. She has no wheezes. She  has no rhonchi. She has no rales.  Abdominal: Bowel sounds are normal.  Neurological: She is alert and oriented to person, place, and time.  Skin: Skin is warm and dry.  Nursing note and vitals reviewed.    ED Treatments / Results  Labs (all labs ordered are listed, but only abnormal results are displayed) Labs Reviewed  CBC WITH DIFFERENTIAL/PLATELET - Abnormal; Notable for the following components:      Result Value   Hemoglobin 10.3 (*)    HCT 33.2 (*)    All other components within normal limits  TROPONIN I - Abnormal; Notable for the following components:   Troponin I 0.07 (*)    All other components within normal limits  D-DIMER, QUANTITATIVE (NOT AT [facility]) - Abnormal; Notable for the following components:   D-Dimer, Quant 7.26 (*)    All other components within normal limits  BASIC METABOLIC PANEL  BRAIN NATRIURETIC PEPTIDE  PREGNANCY, URINE    EKG  EKG Interpretation  Date/Time:  Tuesday April 17 2017 15:14:48 EDT Ventricular Rate:  85 PR Interval:    QRS Duration: 91 QT Interval:  371 QTC Calculation: 442 R Axis:   27 Text Interpretation:  Sinus rhythm No acute changes Nonspecific ST and T wave abnormality No significant change since last tracing Confirmed by Derwood Kaplan [phone]) on 04/17/2017 4:02:58 PM       Radiology Dg Chest 2 View  Result Date: 04/17/2017 CLINICAL DATA:  Chest pain and tightness with shortness of breath. EXAM: CHEST - 2 VIEW COMPARISON:  Chest x-ray dated April 26, 2016. FINDINGS: The heart size and mediastinal contours are within normal limits. Both lungs are clear. The visualized skeletal structures are unremarkable. IMPRESSION: No active cardiopulmonary disease. Electronically Signed   By: Obie Dredge M.D.   On: 04/17/2017 15:36   Ct Angio Chest Pe W And/or Wo Contrast  Result Date: 04/17/2017 CLINICAL DATA:  Chest pain and tightness with dyspnea since Sunday after walk. One month postop for gastric sleeve procedure. EXAM: CT  ANGIOGRAPHY CHEST WITH CONTRAST TECHNIQUE: Multidetector CT imaging of the chest was performed using the standard protocol during bolus administration of intravenous contrast. Multiplanar CT image reconstructions and MIPs were obtained to evaluate the vascular anatomy. CONTRAST:  ISOVUE-370 IOPAMIDOL (ISOVUE-370) INJECTION 76% COMPARISON:  CXR 04/17/2017 FINDINGS: Cardiovascular: The study is of quality for the evaluation of pulmonary embolism. Acute bilateral pulmonary emboli noted at the branch point of the right main pulmonary artery extending into all lobes as well as to the left lower lobe and lingula. RV/LV ratio is 0.67. Great vessels are normal in course and caliber. Normal heart size. No significant pericardial fluid/thickening. Mediastinum/Nodes: No discrete thyroid nodules. Unremarkable esophagus. No pathologically enlarged axillary, mediastinal or hilar lymph nodes. Lungs/Pleura: No pneumothorax. No pleural effusion. Lungs are clear Upper abdomen: Staple line across the stomach from gastric sleeve procedure. No upper abdominal abnormality. Musculoskeletal:  No aggressive appearing focal osseous lesions.  Review of the MIP images confirms the above findings. IMPRESSION: Positive for acute bilateral multilobar PE without CT evidence of right heart strain (RV/LV Ratio = 0.67). These results were called by telephone at the time of interpretation on 04/17/2017 at 7:05 pm to Dr. Derwood Kaplan , who verbally acknowledged these results. Electronically Signed   By: Tollie Eth M.D.   On: 04/17/2017 19:05    Procedures Procedures (including critical care time)  CRITICAL CARE Performed by: Greysen Devino   Total critical care time: 34 minutes  Critical care time was exclusive of separately billable procedures and treating other patients.  Critical care was necessary to treat or prevent imminent or life-threatening deterioration.  Critical care was time spent personally by me on the following  activities: development of treatment plan with patient and/or surrogate as well as nursing, discussions with consultants, evaluation of patient's response to treatment, examination of patient, obtaining history from patient or surrogate, ordering and performing treatments and interventions, ordering and review of laboratory studies, ordering and review of radiographic studies, pulse oximetry and re-evaluation of patient's condition.   Medications Ordered in ED Medications  iopamidol (ISOVUE-370) 76 % injection (100 mLs  Contrast Given 04/17/17 1845)     Initial Impression / Assessment and Plan / ED Course  I have reviewed the triage vital signs and the nursing notes.  Pertinent labs & imaging results that were available during my care of the patient were reviewed by me and considered in my medical decision making (see chart for details).     37 year old female with history of recent gastrectomy, long-term use of oral contraceptives comes in with chief complaint of shortness of breath and associated chest tightness.  Her pain is nonspecific, however suspicion for PE is moderately high based on Wells score.  D-dimer ordered and elevated.  CT scan shows bilateral PE.  We will start patient on heparin.  Fortunately does not appear that she is having cor pulmonale.  Admit to medicine.  Final Clinical Impressions(s) / ED Diagnoses   Final diagnoses:  Other acute pulmonary embolism without acute cor pulmonale [facility])    ED Discharge Orders    None       Derwood Kaplan, MD 04/17/17 Izell Rice    Derwood Kaplan, MD 04/17/17 1919

## 2017-04-17 NOTE — ED Notes (Signed)
Pt is alert and oriented x 4 and is verbally responsive. Pt reports that she had been walking in the park and while uphill began having SOB with chest tightness, Pt does reports having a Gastric sleeve procedure 1 month ago as well as been having heavy periods d/t changes in contraceptive medications. Pt reports that her vaginal bleeding has ceased since yesterday.

## 2017-04-17 NOTE — ED Triage Notes (Signed)
Pt c/o chest pain and tightness with SOB since Sunday after a walk. She is 1 month post-op for a gastric sleeve. Says she can hardly walk without feeling winded. No dizziness or syncope present.

## 2017-04-17 NOTE — ED Notes (Signed)
Patient transported to CT 

## 2017-04-17 NOTE — ED Notes (Signed)
Called report to Joanne.

## 2017-04-17 NOTE — Progress Notes (Signed)
ANTICOAGULATION CONSULT NOTE -  Pharmacy Consult for Heparin Indication: pulmonary embolus  Allergies  Allergen Reactions  . Penicillins Nausea And Vomiting and Other (See Comments)    Yeast infection Has patient had a PCN reaction causing immediate rash, facial/tongue/throat swelling, SOB or lightheadedness with hypotension: Yes Has patient had a PCN reaction causing severe rash involving mucus membranes or skin necrosis: No Has patient had a PCN reaction that required hospitalization: No Has patient had a PCN reaction occurring within the last 10 years: No If all of the above answers are "NO", then may proceed with Cephalosporin use.    Patient Measurements: Height: 5\' 11"  (180.3 cm) Weight: (!) 377 lb (171 kg) IBW/kg (Calculated) : 70.8 Heparin Dosing Weight: 113  Vital Signs: Temp: 98.1 F (36.7 C) (03/19 1510) Temp Source: Oral (03/19 1510) BP: 149/93 (03/19 1805) Pulse Rate: 83 (03/19 1805)  Labs: Recent Labs    04/17/17 1639  HGB 10.3*  HCT 33.2*  PLT 349  CREATININE 0.92  TROPONINI 0.07*   Estimated Creatinine Clearance: 145.2 mL/min (by C-G formula based on SCr of 0.92 mg/dL).  Medical History: Past Medical History:  Diagnosis Date  . Hypertension    Medications:  Scheduled:  . CENTRUM MULTIGUMMIES   Oral Daily  . heparin  4,000 Units Intravenous Once  . megestrol  200 mg Oral BID   Infusions:  . heparin     Assessment: 36 yoF to ED 3/19 with c/o chest pain. Current use of OCP & Megace, s/p Gastric sleeve 2/19.  Hx of 2-3 days increasing ShOB with chest tightness.  Denies tobacco use. Begin Heparin for PE  Goal of Therapy:  Heparin level 0.3-0.7 units/ml Monitor platelets by anticoagulation protocol: Yes   Plan:   Heparin 3000 unit bolus, begin infusion at 2000 units/hr  First Heparin level in 6 hr, ~ 0300  Daily CBC while on Heparin, daily Hep level once level therapeutic at steady state  Otho BellowsGreen, Xitlalic Maslin L PharmD Pager [phone] 04/17/2017,  7:53 PM

## 2017-04-18 ENCOUNTER — Observation Stay (HOSPITAL_COMMUNITY): Payer: BLUE CROSS/BLUE SHIELD

## 2017-04-18 ENCOUNTER — Observation Stay (HOSPITAL_BASED_OUTPATIENT_CLINIC_OR_DEPARTMENT_OTHER): Payer: BLUE CROSS/BLUE SHIELD

## 2017-04-18 DIAGNOSIS — I2699 Other pulmonary embolism without acute cor pulmonale: Secondary | ICD-10-CM

## 2017-04-18 DIAGNOSIS — I361 Nonrheumatic tricuspid (valve) insufficiency: Secondary | ICD-10-CM | POA: Diagnosis not present

## 2017-04-18 LAB — HEPARIN LEVEL (UNFRACTIONATED)
HEPARIN UNFRACTIONATED: 0.44 [IU]/mL (ref 0.30–0.70)
Heparin Unfractionated: 0.35 IU/mL (ref 0.30–0.70)
Heparin Unfractionated: 0.35 IU/mL (ref 0.30–0.70)

## 2017-04-18 LAB — ECHOCARDIOGRAM COMPLETE
HEIGHTINCHES: 71 in
WEIGHTICAEL: 6003.2 [oz_av]

## 2017-04-18 LAB — CBC
HCT: 30.2 % — ABNORMAL LOW (ref 36.0–46.0)
HEMOGLOBIN: 9.5 g/dL — AB (ref 12.0–15.0)
MCH: 26 pg (ref 26.0–34.0)
MCHC: 31.5 g/dL (ref 30.0–36.0)
MCV: 82.7 fL (ref 78.0–100.0)
Platelets: 306 10*3/uL (ref 150–400)
RBC: 3.65 MIL/uL — ABNORMAL LOW (ref 3.87–5.11)
RDW: 15.2 % (ref 11.5–15.5)
WBC: 8.5 10*3/uL (ref 4.0–10.5)

## 2017-04-18 LAB — HIV ANTIBODY (ROUTINE TESTING W REFLEX): HIV Screen 4th Generation wRfx: NONREACTIVE

## 2017-04-18 MED ORDER — HEPARIN (PORCINE) IN NACL 100-0.45 UNIT/ML-% IJ SOLN
2100.0000 [IU]/h | INTRAMUSCULAR | Status: DC
  Administered 2017-04-18 – 2017-04-19 (×3): 2100 [IU]/h via INTRAVENOUS
  Filled 2017-04-18 (×3): qty 250

## 2017-04-18 NOTE — Progress Notes (Addendum)
LE venous duplex prelim: Limited study due to body habitus, however intramuscular DVT noted in the left soleal veins. No DVT RLE. Farrel DemarkJill Eunice, RDMS, RVT RN was in room and aware of results.

## 2017-04-18 NOTE — Progress Notes (Signed)
Pharmacy Note re: Heparin for PE  O: HL=0.44 on 2100 units/hr No bleeding or infusion issues reported by RN  A/P:  Heparin remains within therapeutic range.          Continue heparin infusion at current rate.          Follow-up daily heparin level.           Junita PushMichelle Roshunda Keir, PharmD, BCPS 04/18/2017@6 :46 PM

## 2017-04-18 NOTE — Progress Notes (Signed)
  Echocardiogram 2D Echocardiogram has been performed.  Eva Griffo G Broady Lafoy 04/18/2017, 1:13 PM

## 2017-04-18 NOTE — Progress Notes (Signed)
ANTICOAGULATION CONSULT NOTE -  Pharmacy Consult for Heparin Indication: pulmonary embolus  Allergies  Allergen Reactions  . Penicillins Nausea And Vomiting and Other (See Comments)    Yeast infection Has patient had a PCN reaction causing immediate rash, facial/tongue/throat swelling, SOB or lightheadedness with hypotension: Yes Has patient had a PCN reaction causing severe rash involving mucus membranes or skin necrosis: No Has patient had a PCN reaction that required hospitalization: No Has patient had a PCN reaction occurring within the last 10 years: No If all of the above answers are "NO", then may proceed with Cephalosporin use.    Patient Measurements: Height: 5\' 11"  (180.3 cm) Weight: (!) 375 lb 3.2 oz (170.2 kg) IBW/kg (Calculated) : 70.8 Heparin Dosing Weight: 113  Vital Signs: Temp: 98.8 F (37.1 C) (03/19 2144) Temp Source: Oral (03/19 2144) BP: 178/91 (03/19 2144) Pulse Rate: 88 (03/19 2144)  Labs: Recent Labs    04/17/17 1639 04/18/17 0303  HGB 10.3* 9.5*  HCT 33.2* 30.2*  PLT 349 306  HEPARINUNFRC  --  0.35  CREATININE 0.92  --   TROPONINI 0.07*  --    Estimated Creatinine Clearance: 144.8 mL/min (by C-G formula based on SCr of 0.92 mg/dL).  Medical History: Past Medical History:  Diagnosis Date  . Hypertension    Medications:  Scheduled:  . megestrol  200 mg Oral BID  . multivitamin-iron-minerals-folic acid  1 tablet Oral Daily   Infusions:  . heparin     Assessment: 36 yoF to ED 3/19 with c/o chest pain. Current use of OCP & Megace, s/p Gastric sleeve 2/19.  Hx of 2-3 days increasing ShOB with chest tightness.  Denies tobacco use. Begin Heparin for PE Today, 3/20  0303 HL = 0.35 at low end of goal, no infusion or bleeding issues per RN Goal of Therapy:  Heparin level 0.3-0.7 units/ml Monitor platelets by anticoagulation protocol: Yes   Plan:    Will increase heparin drip slightly to 2100 units/hr to ensure stays in therapeutic range  as bolus wears off  Recheck HL in 6 hours  Daily CBC while on Heparin, daily Hep level once level therapeutic at steady state   Lorenza EvangelistGreen, Baileigh Modisette R 04/18/2017, 3:38 AM

## 2017-04-18 NOTE — Progress Notes (Signed)
ANTICOAGULATION CONSULT NOTE -  Pharmacy Consult for Heparin Indication: pulmonary embolus  Allergies  Allergen Reactions  . Penicillins Nausea And Vomiting and Other (See Comments)    Yeast infection Has patient had a PCN reaction causing immediate rash, facial/tongue/throat swelling, SOB or lightheadedness with hypotension: Yes Has patient had a PCN reaction causing severe rash involving mucus membranes or skin necrosis: No Has patient had a PCN reaction that required hospitalization: No Has patient had a PCN reaction occurring within the last 10 years: No If all of the above answers are "NO", then may proceed with Cephalosporin use.    Patient Measurements: Height: 5\' 11"  (180.3 cm) Weight: (!) 375 lb 3.2 oz (170.2 kg) IBW/kg (Calculated) : 70.8 Heparin Dosing Weight: 113  Vital Signs: Temp: 98.2 F (36.8 C) (03/20 0353) Temp Source: Oral (03/20 0353) BP: 135/75 (03/20 0353) Pulse Rate: 79 (03/20 0353)  Labs: Recent Labs    04/17/17 1639 04/18/17 0303 04/18/17 1044  HGB 10.3* 9.5*  --   HCT 33.2* 30.2*  --   PLT 349 306  --   HEPARINUNFRC  --  0.35 0.35  CREATININE 0.92  --   --   TROPONINI 0.07*  --   --    Estimated Creatinine Clearance: 144.8 mL/min (by C-G formula based on SCr of 0.92 mg/dL).  Medical History: Past Medical History:  Diagnosis Date  . Hypertension    Medications:  Scheduled:  . megestrol  200 mg Oral BID  . multivitamin-iron-minerals-folic acid  1 tablet Oral Daily   Infusions:  . heparin 2,100 Units/hr (04/18/17 0727)   Assessment: 36 yoF to ED 3/19 with c/o chest pain. Current use of OCP & Megace, s/p Gastric sleeve 2/19.  Hx of 2-3 days increasing ShOB with chest tightness.  Denies tobacco use. Begin Heparin for PE Today, 3/20  0303 HL = 0.35 at low end of goal, no infusion or bleeding issues per RN  1044 HL is 0.35, at lower end but therapeutic, no infusion or bleeding issues per RN   Hgb 9.5, plt 306   Goal of Therapy:   Heparin level 0.3-0.7 units/ml Monitor platelets by anticoagulation protocol: Yes   Plan:    Continue  heparin drip at  2100 units/hr   Recheck HL in 6 hours  Daily CBC while on Heparin, daily Hep level once level therapeutic at steady [address]   Gradie Ohm, PharmD, BCPS Pager [phone] 04/18/2017 12:32 PM

## 2017-04-18 NOTE — Progress Notes (Signed)
PROGRESS NOTE  Ashley Miller [id number] DOB: September 05, 1980 DOA: 04/17/2017 PCP: Jackie Plumsei-Bonsu, George, MD  HPI/Recap of past 24 hours:  Feeling better, less pleuritic chest pain, no fever, no cough, no hypoxia at rest Remain DOE when walk to the bathroom  She reports lower extremity edema a few weeks ago.  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 -She started on heparin drip, will continue for now  Atypical endometrial hyperplasia -Patient reported recently restarted back on Megace which has helped her abdominal cramping and no bleed -We will need to discuss with GYN Dr. Noland FordyceLentz  Normocytic anemia -Likely from menorrhagia -Hemoglobin 11 in 03/2017, hgb 9.5 now -Repeat cbc in am  Morbid obesity: Body mass index is 52.33 kg/m.  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   Procedures:  none  Antibiotics:  none   Objective: BP 137/78 (BP Location: Left Arm)   Pulse 80   Temp 97.7 F (36.5 C) (Oral)   Resp 18   Ht 5\' 11"  (1.803 m)   Wt (!) 170.2 kg (375 lb 3.2 oz)   LMP 03/19/2017 Comment: spotting 03/19/17  SpO2 95%   BMI 52.33 kg/m   Intake/Output Summary (Last 24 hours)  at 04/18/2017 1756 Last data filed at 04/18/2017 [id number] Gross per 24 hour  Intake 323.2 ml  Output 900 ml  Net -576.8 ml   Filed Weights   04/17/17 1635 04/17/17 2144  Weight: (!) 171 kg (377 lb) (!) 170.2 kg (375 lb 3.2 oz)    Exam: Patient is examined daily including today on 04/18/2017, exams remain the same as of yesterday except that has changed    General:  NAD, obese   Cardiovascular: RRR  Respiratory: CTABL  Abdomen: Soft/ND/NT, positive BS  Musculoskeletal: No Edema  Neuro: alert, oriented   Data Reviewed: Basic Metabolic Panel: Recent Labs  Lab 04/17/17 1639  NA 140  K 3.7  CL 107  CO2 24  GLUCOSE 94  BUN 8  CREATININE 0.92  CALCIUM 9.0   Liver Function Tests: No results for input(s): AST, ALT, ALKPHOS, BILITOT, PROT, ALBUMIN in the last 168 hours. No results for input(s): LIPASE, AMYLASE in the last 168 hours. No results for input(s): AMMONIA in the last 168 hours. CBC: Recent Labs  Lab 04/17/17 1639 04/18/17 0303  WBC 8.8 8.5  NEUTROABS 5.5  --   HGB 10.3* 9.5*  HCT 33.2* 30.2*  MCV 84.5 82.7  PLT 349 306   Cardiac Enzymes:   Recent Labs  Lab 04/17/17 1639  TROPONINI 0.07*   BNP (last 3 results) Recent Labs    04/17/17 1639  BNP 32.0    ProBNP (last 3 results) No results for input(s): PROBNP in the last 8760 hours.  CBG: No results for input(s): GLUCAP  in the last 168 hours.  No results found for this or any previous visit (from the past 240 hour(s)).   Studies: Ct Angio Chest Pe W And/or Wo Contrast  Result Date: 04/17/2017 CLINICAL DATA:  Chest pain and tightness with dyspnea since Sunday after walk. One month postop for gastric sleeve procedure. EXAM: CT ANGIOGRAPHY CHEST WITH CONTRAST TECHNIQUE: Multidetector CT imaging of the chest was performed using the standard protocol during bolus administration of intravenous contrast. Multiplanar CT image reconstructions and MIPs were obtained to evaluate the vascular anatomy.  CONTRAST:  ISOVUE-370 IOPAMIDOL (ISOVUE-370) INJECTION 76% COMPARISON:  CXR 04/17/2017 FINDINGS: Cardiovascular: The study is of quality for the evaluation of pulmonary embolism. Acute bilateral pulmonary emboli noted at the branch point of the right main pulmonary artery extending into all lobes as well as to the left lower lobe and lingula. RV/LV ratio is 0.67. Great vessels are normal in course and caliber. Normal heart size. No significant pericardial fluid/thickening. Mediastinum/Nodes: No discrete thyroid nodules. Unremarkable esophagus. No pathologically enlarged axillary, mediastinal or hilar lymph nodes. Lungs/Pleura: No pneumothorax. No pleural effusion. Lungs are clear Upper abdomen: Staple line across the stomach from gastric sleeve procedure. No upper abdominal abnormality. Musculoskeletal:  No aggressive appearing focal osseous lesions. Review of the MIP images confirms the above findings. IMPRESSION: Positive for acute bilateral multilobar PE without CT evidence of right heart strain (RV/LV Ratio = 0.67). These results were called by telephone at the time of interpretation on 04/17/2017 at 7:05 pm to Dr. Derwood Kaplan , who verbally acknowledged these results. Electronically Signed   By: Tollie Eth M.D.   On: 04/17/2017 19:05    Scheduled Meds: . megestrol  200 mg Oral BID  . multivitamin-iron-minerals-folic acid  1 tablet Oral Daily    Continuous Infusions: . heparin 2,100 Units/hr (04/18/17 0727)     Time spent: I have personally reviewed and interpreted on  04/18/2017 daily labs, tele strips, imagings as discussed above under date review session and assessment and plans.  I reviewed all nursing notes, pharmacy notes, consultant notes,  vitals, pertinent old records  I have discussed plan of care as described above with RN , patient  on 04/18/2017   Albertine Grates MD, PhD  [facility] Pager [phone]. If 7PM-7AM, please contact night-coverage at [email or web address],  password [facility] 04/18/2017, 5:56 PM  LOS: 0 days

## 2017-04-19 DIAGNOSIS — N8502 Endometrial intraepithelial neoplasia [EIN]: Secondary | ICD-10-CM | POA: Diagnosis present

## 2017-04-19 DIAGNOSIS — D649 Anemia, unspecified: Secondary | ICD-10-CM | POA: Diagnosis present

## 2017-04-19 DIAGNOSIS — N920 Excessive and frequent menstruation with regular cycle: Secondary | ICD-10-CM | POA: Diagnosis present

## 2017-04-19 DIAGNOSIS — I2699 Other pulmonary embolism without acute cor pulmonale: Secondary | ICD-10-CM | POA: Diagnosis not present

## 2017-04-19 DIAGNOSIS — Z6841 Body Mass Index (BMI) 40.0 and over, adult: Secondary | ICD-10-CM | POA: Diagnosis not present

## 2017-04-19 DIAGNOSIS — Z9884 Bariatric surgery status: Secondary | ICD-10-CM | POA: Diagnosis not present

## 2017-04-19 DIAGNOSIS — Z880 Allergy status to penicillin: Secondary | ICD-10-CM | POA: Diagnosis not present

## 2017-04-19 DIAGNOSIS — Z79811 Long term (current) use of aromatase inhibitors: Secondary | ICD-10-CM | POA: Diagnosis not present

## 2017-04-19 DIAGNOSIS — I10 Essential (primary) hypertension: Secondary | ICD-10-CM | POA: Diagnosis present

## 2017-04-19 DIAGNOSIS — I824Z2 Acute embolism and thrombosis of unspecified deep veins of left distal lower extremity: Secondary | ICD-10-CM | POA: Diagnosis present

## 2017-04-19 DIAGNOSIS — Z9889 Other specified postprocedural states: Secondary | ICD-10-CM | POA: Diagnosis not present

## 2017-04-19 LAB — CBC
HCT: 31.4 % — ABNORMAL LOW (ref 36.0–46.0)
HEMOGLOBIN: 9.5 g/dL — AB (ref 12.0–15.0)
MCH: 25.6 pg — AB (ref 26.0–34.0)
MCHC: 30.3 g/dL (ref 30.0–36.0)
MCV: 84.6 fL (ref 78.0–100.0)
Platelets: 347 10*3/uL (ref 150–400)
RBC: 3.71 MIL/uL — ABNORMAL LOW (ref 3.87–5.11)
RDW: 15.5 % (ref 11.5–15.5)
WBC: 8.1 10*3/uL (ref 4.0–10.5)

## 2017-04-19 LAB — BASIC METABOLIC PANEL
ANION GAP: 10 (ref 5–15)
BUN: 5 mg/dL — ABNORMAL LOW (ref 6–20)
CO2: 23 mmol/L (ref 22–32)
Calcium: 8.3 mg/dL — ABNORMAL LOW (ref 8.9–10.3)
Chloride: 107 mmol/L (ref 101–111)
Creatinine, Ser: 0.84 mg/dL (ref 0.44–1.00)
Glucose, Bld: 98 mg/dL (ref 65–99)
POTASSIUM: 3.5 mmol/L (ref 3.5–5.1)
Sodium: 140 mmol/L (ref 135–145)

## 2017-04-19 LAB — MAGNESIUM: Magnesium: 1.9 mg/dL (ref 1.7–2.4)

## 2017-04-19 LAB — HEPARIN LEVEL (UNFRACTIONATED): HEPARIN UNFRACTIONATED: 0.51 [IU]/mL (ref 0.30–0.70)

## 2017-04-19 LAB — TSH: TSH: 1.384 u[IU]/mL (ref 0.350–4.500)

## 2017-04-19 MED ORDER — POLYETHYLENE GLYCOL 3350 17 G PO PACK
17.0000 g | PACK | Freq: Every day | ORAL | Status: DC | PRN
  Administered 2017-04-19: 17 g via ORAL
  Filled 2017-04-19: qty 1

## 2017-04-19 MED ORDER — ENOXAPARIN SODIUM 300 MG/3ML IJ SOLN
160.0000 mg | Freq: Two times a day (BID) | INTRAMUSCULAR | Status: DC
  Administered 2017-04-19 – 2017-04-20 (×3): 160 mg via SUBCUTANEOUS
  Filled 2017-04-19 (×5): qty 1.6

## 2017-04-19 MED ORDER — ENOXAPARIN (LOVENOX) PATIENT EDUCATION KIT
PACK | Freq: Once | Status: AC
  Administered 2017-04-19: 23:00:00
  Filled 2017-04-19 (×2): qty 1

## 2017-04-19 NOTE — Progress Notes (Signed)
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 -She started on heparin drip, transition to lovenox ( need to monitor antiXa level ) for adequate dosing due to obesity. Need to monitor vaginal bleeding , hgb  Atypical endometrial hyperplasia -Patient reported recently restarted back on Megace which has helped her abdominal cramping and no bleed -case discussed with GYN Dr. Noland FordyceLentz over the phone, he is ok with holding megace for now. He will arrange close follow up for IUD placement  If patient able to d/c home, otherwise will need gyn on call for this hospital. If she developed significant bleed while on anticoagulation   Normocytic anemia -Likely from menorrhagia -Hemoglobin 11 in 03/2017, hgb 9.5 now -Repeat cbc in am  Morbid obesity: Body mass index is 52.33 kg/m.  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   Procedures:  none  Antibiotics:  none   Objective: BP  133/77   Pulse 88   Temp 97.8 F (36.6 C) (Oral)   Resp 18   Ht 5\' 11"  (1.803 m)   Wt (!) 170.2 kg (375 lb 3.2 oz)   SpO2 97%   BMI 52.33 kg/m   Intake/Output Summary (Last 24 hours) at 04/19/2017 1811 Last data filed at 04/19/2017 1700 Gross per 24 hour  Intake 1744.23 ml  Output -  Net 1744.23 ml   Filed Weights   04/17/17 1635 04/17/17 2144  Weight: (!) 171 kg (377 lb) (!) 170.2 kg (375 lb 3.2 oz)    Exam: Patient is examined daily including today on 04/19/2017, exams remain the same as of yesterday except that has changed    General:  NAD, obese   Cardiovascular: RRR  Respiratory: CTABL  Abdomen: Soft/ND/NT, positive BS  Musculoskeletal: No Edema  Neuro: alert, oriented   Data Reviewed: Basic Metabolic Panel: Recent Labs  Lab 04/17/17 1639 04/19/17 0453  NA 140 140  K 3.7 3.5  CL 107 107  CO2 24 23  GLUCOSE 94 98  BUN 8 5*  CREATININE 0.92 0.84  CALCIUM 9.0 8.3*  MG  --  1.9   Liver Function Tests: No results for input(s): AST, ALT, ALKPHOS, BILITOT, PROT, ALBUMIN in the last 168 hours. No results for input(s): LIPASE, AMYLASE in the last 168 hours. No results for input(s): AMMONIA in the last 168 hours. CBC: Recent Labs  Lab 04/17/17 1639 04/18/17 0303 04/19/17 0453  WBC 8.8 8.5 8.1  NEUTROABS 5.5  --   --   HGB 10.3* 9.5* 9.5*  HCT 33.2* 30.2* 31.4*  MCV 84.5 82.7 84.6  PLT 349 306 347   Cardiac Enzymes:   Recent Labs  Lab 04/17/17 1639  TROPONINI 0.07*   BNP (last 3 results) Recent Labs    04/17/17 1639  BNP 32.0    ProBNP (last 3 results) No results for input(s): PROBNP in the last 8760 hours.  CBG: No results for input(s): GLUCAP in the last 168 hours.  No results found for this or any previous visit (from the past 240 hour(s)).   Studies: No results found.  Scheduled Meds: . enoxaparin (LOVENOX) injection  160 mg Subcutaneous Q12H  . enoxaparin   Does not apply Once  . megestrol  200 mg Oral BID  .  multivitamin-iron-minerals-folic acid  1 tablet Oral Daily    Continuous Infusions:    Time spent: , more than 50% time spent on coordination of care. I have personally reviewed and interpreted on  04/19/2017 daily labs, tele strips, imagings as discussed above under date review session and assessment and plans.  I reviewed all nursing notes, pharmacy notes, consultant notes,  vitals, pertinent old records  I have discussed plan of care as described above with RN , patient  on 04/19/2017   Albertine Grates MD, PhD  [facility] Pager [phone]. If 7PM-7AM, please contact night-coverage at [email or web address], password [facility] 04/19/2017, 6:11 PM  LOS: 0 days

## 2017-04-19 NOTE — Progress Notes (Signed)
Lovenox no co-pay and no prior authorization needed.

## 2017-04-19 NOTE — Progress Notes (Signed)
Patient did not want to take the Megace until she has talked with the MD. As per the patient, the MD wanted to D/C the Megace yesterday. There were no written orders r/t Megace.

## 2017-04-19 NOTE — Progress Notes (Addendum)
ANTICOAGULATION CONSULT NOTE -  Pharmacy Consult for Heparin to enoxaparin Indication: pulmonary embolus  Allergies  Allergen Reactions  . Penicillins Nausea And Vomiting and Other (See Comments)    Yeast infection Has patient had a PCN reaction causing immediate rash, facial/tongue/throat swelling, SOB or lightheadedness with hypotension: Yes Has patient had a PCN reaction causing severe rash involving mucus membranes or skin necrosis: No Has patient had a PCN reaction that required hospitalization: No Has patient had a PCN reaction occurring within the last 10 years: No If all of the above answers are "NO", then may proceed with Cephalosporin use.    Patient Measurements: Height: 5\' 11"  (180.3 cm) Weight: (!) 375 lb 3.2 oz (170.2 kg) IBW/kg (Calculated) : 70.8 Heparin Dosing Weight: 113  Vital Signs: Temp: 98.2 F (36.8 C) (03/21 0459) Temp Source: Oral (03/21 0459) BP: 150/87 (03/21 0459) Pulse Rate: 88 (03/21 0459)  Labs: Recent Labs    04/17/17 1639  04/18/17 0303 04/18/17 1044 04/18/17 1752 04/19/17 0453  HGB 10.3*  --  9.5*  --   --  9.5*  HCT 33.2*  --  30.2*  --   --  31.4*  PLT 349  --  306  --   --  347  HEPARINUNFRC  --    < > 0.35 0.35 0.44 0.51  CREATININE 0.92  --   --   --   --  0.84  TROPONINI 0.07*  --   --   --   --   --    < > = values in this interval not displayed.   Estimated Creatinine Clearance: 158.5 mL/min (by C-G formula based on SCr of 0.84 mg/dL).  Medical History: Past Medical History:  Diagnosis Date  . Hypertension    Medications:  Scheduled:  . megestrol  200 mg Oral BID  . multivitamin-iron-minerals-folic acid  1 tablet Oral Daily   Infusions:  . heparin 2,100 Units/hr (04/19/17 0507)   Assessment: 36 yoF to ED 3/19 with c/o chest pain. Current use of OCP & Megace, s/p Gastric sleeve 2/19.  Hx of 2-3 days increasing ShOB with chest tightness.  Denies tobacco use. Begin Heparin for PE   Today, 3/20  Heparin level  remains therapeutic this am at 0.51 on heparin gtt at 2100 units/hr  CBC: Hgb decreased but stable, pltc WNL  Patient with h/o vaginal bleeding (on megace per OB)  No bleeding noted at this time  Due to BMI > 40 (and recent bariatric surgery - sleeve gastrectomy), recommend warfarin or LWMH over DOAC.    Goal of Therapy:  Heparin level 0.3-0.7 units/ml Monitor platelets by anticoagulation protocol: Yes   Plan:    Plan to transition to enoxaparin 160mg  SQ q12h. Turn heparin off then given 1st dose on enoxaparin 1h later.    Use this dose as will be more convenient to use 80mg  syringes but, unfortunately, will require two syringes for each dose  CM looking into insurance benefits for cost  Plan to check level after 3rd dose (3/22 at ~1400).  Pharmacy to evaluate level to determine if can use 150mg  SQ q12h (or lower dose so will only need one syringe per dose)  CBC at least q72h while in hospital. D/c heparin levels  Monitor for bleeding  Juliette Alcideustin Zeigler, PharmD, BCPS.   Pager: [phone] 04/19/2017 8:18 AM

## 2017-04-19 NOTE — Progress Notes (Signed)
Patient walking in hallway.  Offered patient protein shakes to supplement current diet as she is a month out from surgery.  Concerned about Lovenox injections at home.  We had a brief discussion on how to give Lovenox. I explained her nurse would furnish further instruction along with teaching kit.  Contact information provided to patient and husband should they have further bariatric questions.   [facility] Nurse Coordinator Direct dial [phone] Pager [phone]

## 2017-04-20 LAB — CBC
HEMATOCRIT: 31.6 % — AB (ref 36.0–46.0)
Hemoglobin: 9.8 g/dL — ABNORMAL LOW (ref 12.0–15.0)
MCH: 26.3 pg (ref 26.0–34.0)
MCHC: 31 g/dL (ref 30.0–36.0)
MCV: 84.9 fL (ref 78.0–100.0)
Platelets: 356 10*3/uL (ref 150–400)
RBC: 3.72 MIL/uL — ABNORMAL LOW (ref 3.87–5.11)
RDW: 15.4 % (ref 11.5–15.5)
WBC: 8 10*3/uL (ref 4.0–10.5)

## 2017-04-20 LAB — HEPARIN ANTI-XA: Heparin LMW: 0.7 IU/mL

## 2017-04-20 MED ORDER — ENOXAPARIN SODIUM 150 MG/ML ~~LOC~~ SOLN: 150.0000 mg | Freq: Two times a day (BID) | SUBCUTANEOUS | 0 refills | Status: DC

## 2017-04-20 MED ORDER — ENOXAPARIN SODIUM 150 MG/ML ~~LOC~~ SOLN
150.0000 mg | Freq: Two times a day (BID) | SUBCUTANEOUS | Status: DC
  Filled 2017-04-20: qty 1

## 2017-04-20 MED FILL — ENOXAPARIN 150 MG/ML SYR: 150 | 30 days supply | Qty: 60 | Fill #0

## 2017-04-20 NOTE — Progress Notes (Signed)
ANTICOAGULATION CONSULT NOTE -  Pharmacy Consult for Heparin to enoxaparin Indication: pulmonary embolus  Allergies  Allergen Reactions  . Penicillins Nausea And Vomiting and Other (See Comments)    Yeast infection Has patient had a PCN reaction causing immediate rash, facial/tongue/throat swelling, SOB or lightheadedness with hypotension: Yes Has patient had a PCN reaction causing severe rash involving mucus membranes or skin necrosis: No Has patient had a PCN reaction that required hospitalization: No Has patient had a PCN reaction occurring within the last 10 years: No If all of the above answers are "NO", then may proceed with Cephalosporin use.    Patient Measurements: Height: 5\' 11"  (180.3 cm) Weight: (!) 375 lb 3.2 oz (170.2 kg) IBW/kg (Calculated) : 70.8 Heparin Dosing Weight: 113  Vital Signs: Temp: 98.2 F (36.8 C) (03/22 1245) Temp Source: Oral (03/22 1245) BP: 159/93 (03/22 1245) Pulse Rate: 87 (03/22 1245)  Labs: Recent Labs    04/18/17 0303 04/18/17 1044 04/18/17 1752 04/19/17 0453 04/20/17 0506 04/20/17 1532  HGB 9.5*  --   --  9.5* 9.8*  --   HCT 30.2*  --   --  31.4* 31.6*  --   PLT 306  --   --  347 356  --   HEPARINUNFRC 0.35 0.35 0.44 0.51  --   --   HEPRLOWMOCWT  --   --   --   --   --  0.70  CREATININE  --   --   --  0.84  --   --    Estimated Creatinine Clearance: 158.5 mL/min (by C-G formula based on SCr of 0.84 mg/dL).  Medical History: Past Medical History:  Diagnosis Date  . Hypertension    Medications:  Scheduled:  . enoxaparin (LOVENOX) injection  150 mg Subcutaneous Q12H  . multivitamin-iron-minerals-folic acid  1 tablet Oral Daily   Infusions:   Assessment: 36 yoF to ED 3/19 with c/o chest pain. Current use of OCP & Megace, s/p Gastric sleeve 2/19.  Hx of 2-3 days increasing ShOB with chest tightness.  Denies tobacco use. Begin Heparin for PE   Today, 04/20/2017:  CBC: Hgb decreased but stable, pltc WNL  LMWH level  therapeutic at 0.7 after 3rd dose of Lovenox  Patient on Megace per OB d/t vaginal bleeding; this has been held starting today  No bleeding noted at this time per RN    Goal of Therapy:  Anti-Xa level 0.6-1 units/ml 4hrs after LMWH dose given   Plan:    LMWH level is therapeutic but on the lower end of the desired range. This probably provides enough headroom to allow for the small reduction from 160 to 150 mg SQ q12 hr, but would recommend rechecking LMWH level by 3/29  Plan is to hold Megace at discharge, with OB to weigh risks vs benefits of restarting in this patient with bilateral PE and multiple clotting risk factors  Monitor for bleeding  Bernadene Personrew Jourdan Durbin, PharmD, BCPS [phone] 04/20/2017, 5:08 PM

## 2017-04-20 NOTE — Plan of Care (Signed)
  Problem: Education: Goal: Knowledge of General Education information will improve Outcome: Progressing   Problem: Health Behavior/Discharge Planning: Goal: Ability to manage health-related needs will improve Outcome: Progressing   Problem: Elimination: Goal: Will not experience complications related to bowel motility Outcome: Progressing Goal: Will not experience complications related to urinary retention Outcome: Progressing   Problem: Pain Managment: Goal: General experience of comfort will improve Outcome: Progressing   Problem: Skin Integrity: Goal: Risk for impaired skin integrity will decrease Outcome: Progressing

## 2017-04-20 NOTE — Discharge Summary (Signed)
Discharge Summary  Ashley Kickngela D Darrah JJO:[id number] DOB: [birth year]  PCP: Jackie Plumsei-Bonsu, George, MD  Admit date: 04/17/2017 Discharge date: 04/20/2017  Time spent: >8330mins, more than 50% time spent on coordination of care.  Recommendations for Outpatient Follow-up:  1. F/u with PMD within a week  for hospital discharge follow up, repeat cbc/bmp at follow up.  pcp to repeat anti xa level ( for LMWH in a week, 4hrs after am dosing) pcp to continue to adjust lovenox dose as your continue to loose weight from weight loss surgery. condier switching to coumadin after endometrial biopsy/iud placement by gyn.  2. F/u with gyn Dr Noland FordyceLentz for endometrial biopsy and IUD placement  Discharge Diagnoses:  Active Hospital Problems   Diagnosis Date Noted  . Bilateral pulmonary embolism (HCC) 04/17/2017  . Complex atypical endometrial hyperplasia 04/17/2017  . 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    Discharge Condition: stable  Diet recommendation: regular diet  Filed Weights   04/17/17 1635 04/17/17 2144  Weight: (!) 171 kg (377 lb) (!) 170.2 kg (375 lb 3.2 oz)    History of present illness:  PCP: Jackie Plumsei-Bonsu, George, MD  Patient coming from: Home  I have personally briefly reviewed patient[facility]  Chief Complaint: CP, SOB  HPI: Ashley Miller is a 37 y.o. female with medical history significant of HTN, obesity, complex endometrial hyperplasia being followed by [facility]  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  Patient states that she has been noticing increasing shortness of breath with exertion over the past 2 or 3 days. Patient also has some chest discomfort, described as tightness.There is no specific aggravating or relieving factors with her chest pain. Patient denies any history of PE. She also  denies any history of heavy smoking or substance abuse and there is no premature CAD in the family.   ED Course: B PE without CT evidence of R heart strain see on CT scan.  Heparin gtt started.    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 -She started on heparin drip, transition to lovenox ( need to monitor antiXa level ) for adequate dosing due to obesity. -antixa level at 0.7 , case discussed with pharmacy, patient is to discharge home on lovenox 150mg  bid , repeat antixa level in one week, level need to be between 0.7-1) -pcp to continue adjust lovenox dose as patient now having weigh loss from recent weight loss surgery. pcp to decide on switch to coumadin after endometrial biopsy/iud placement. -she is not started on NOAC due to over weight.   Atypical endometrial hyperplasia -Patient reported recently restarted back on Megace which has helped her abdominal cramping and no bleed -case discussed with GYN Dr. Noland FordyceLentz over the phone, he is ok with holding megace for now. He will arrange close follow up for IUD placement.    Normocytic anemia -Likely from menorrhagia -Hemoglobin 11 in 03/2017, hgb 9.5 now -hgb stable at 9.5, she has mild vaginal spotting  Morbid  obesity: Body mass index is 52.33 kg/m.  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   Procedures:  none  Antibiotics:  none   Discharge Exam: BP (!) 159/93 (BP Location: Right Arm) Comment: Pt just got back from walking  Pulse 87   Temp 98.2 F (36.8 C) (Oral)   Resp 18   Ht 5\' 11"  (1.803 m)   Wt (!) 170.2 kg (375 lb 3.2 oz)   SpO2 99%   BMI 52.33 kg/m   General: NAD, pleasant Cardiovascular: RRR Respiratory: CTABL  Discharge Instructions You were cared for by a hospitalist during your hospital stay. If you have any questions about your discharge medications or the care you received while you were in the hospital after you are discharged, you can call the unit and asked to speak with the hospitalist on call if the hospitalist that took care of you is not available. Once you are discharged, your primary care physician will handle any further medical issues. Please note that NO REFILLS for any discharge medications will be authorized once you are discharged, as it is imperative that you return to your primary care physician (or establish a relationship with a primary care physician if you do not have one) for your aftercare needs so that they can reassess your need for medications and monitor your lab values.  Discharge Instructions    Diet general   Complete by:  As directed    Increase activity slowly   Complete by:  As directed      Allergies as of 04/20/2017      Reactions   Penicillins Nausea And Vomiting, Other (See Comments)   Yeast infection Has patient had a PCN reaction causing immediate rash, facial/tongue/throat swelling, SOB or lightheadedness with hypotension: Yes Has patient had a PCN reaction causing severe rash involving mucus membranes or skin necrosis: No Has patient had a PCN reaction that required hospitalization: No Has patient had a PCN reaction occurring within the last 10 years: No If all of the above answers are "NO", then may proceed with Cephalosporin use.      Medication List    STOP  taking these medications   megestrol 40 MG/ML suspension Commonly known as:  MEGACE   oxyCODONE 5 MG/5ML solution Commonly known as:  ROXICODONE   VICKS VAPORUB EX     TAKE these medications   calcium carbonate 500 MG chewable tablet Commonly known as:  TUMS - dosed in mg elemental calcium Chew 1 tablet by mouth daily as needed for indigestion or heartburn.   CENTRUM MULTIGUMMIES PO Take 1 each by mouth daily.   enoxaparin 150 MG/ML injection Commonly known as:  LOVENOX Inject 1 mL (150 mg total) into the skin every 12 (twelve) hours.      Allergies  Allergen Reactions  . Penicillins Nausea And Vomiting and Other (See Comments)    Yeast infection Has patient had a PCN reaction causing immediate rash, facial/tongue/throat swelling, SOB or lightheadedness with hypotension: Yes Has patient had a PCN reaction causing severe rash involving mucus membranes or skin necrosis: No Has patient had a PCN reaction that required hospitalization: No Has patient had a PCN reaction occurring within the last 10 years: No If all of the above answers are "  NO", then may proceed with Cephalosporin use.    Follow-up Information    Jackie Plum, MD Follow up on 04/23/2017.   Specialty:  Internal Medicine Why:  2pm pcp to repeat anti xa level ( for LMWH in a week, 4hrs after am dosing) pcp to continue to adjust lovenox dose as your continue to loose weight from weight loss surgery. condier switching to coumadin after endometrial biopsy/iud placement by gyn. Contact information: [address] [place] [address] [phone]        Hazle Coca, MD. Schedule an appointment as soon as possible for a visit.   Specialty:  Obstetrics and Gynecology Contact information: [facility] [address] [place] [address] [phone]            The results of significant diagnostics from this hospitalization (including imaging, microbiology, ancillary and  laboratory) are listed below for reference.    Significant Diagnostic Studies: Dg Chest 2 View  Result Date: 04/17/2017 CLINICAL DATA:  Chest pain and tightness with shortness of breath. EXAM: CHEST - 2 VIEW COMPARISON:  Chest x-ray dated April 26, 2016. FINDINGS: The heart size and mediastinal contours are within normal limits. Both lungs are clear. The visualized skeletal structures are unremarkable. IMPRESSION: No active cardiopulmonary disease. Electronically Signed   By: Obie Dredge M.D.   On: 04/17/2017 15:36   Ct Angio Chest Pe W And/or Wo Contrast  Result Date: 04/17/2017 CLINICAL DATA:  Chest pain and tightness with dyspnea since Sunday after walk. One month postop for gastric sleeve procedure. EXAM: CT ANGIOGRAPHY CHEST WITH CONTRAST TECHNIQUE: Multidetector CT imaging of the chest was performed using the standard protocol during bolus administration of intravenous contrast. Multiplanar CT image reconstructions and MIPs were obtained to evaluate the vascular anatomy. CONTRAST:  ISOVUE-370 IOPAMIDOL (ISOVUE-370) INJECTION 76% COMPARISON:  CXR 04/17/2017 FINDINGS: Cardiovascular: The study is of quality for the evaluation of pulmonary embolism. Acute bilateral pulmonary emboli noted at the branch point of the right main pulmonary artery extending into all lobes as well as to the left lower lobe and lingula. RV/LV ratio is 0.67. Great vessels are normal in course and caliber. Normal heart size. No significant pericardial fluid/thickening. Mediastinum/Nodes: No discrete thyroid nodules. Unremarkable esophagus. No pathologically enlarged axillary, mediastinal or hilar lymph nodes. Lungs/Pleura: No pneumothorax. No pleural effusion. Lungs are clear Upper abdomen: Staple line across the stomach from gastric sleeve procedure. No upper abdominal abnormality. Musculoskeletal:  No aggressive appearing focal osseous lesions. Review of the MIP images confirms the above findings. IMPRESSION: Positive  for acute bilateral multilobar PE without CT evidence of right heart strain (RV/LV Ratio = 0.67). These results were called by telephone at the time of interpretation on 04/17/2017 at 7:05 pm to Dr. Derwood Kaplan , who verbally acknowledged these results. Electronically Signed   By: Tollie Eth M.D.   On: 04/17/2017 19:05    Microbiology: No results found for this or any previous visit (from the past 240 hour(s)).   Labs: Basic Metabolic Panel: Recent Labs  Lab 04/17/17 1639 04/19/17 0453  NA 140 140  K 3.7 3.5  CL 107 107  CO2 24 23  GLUCOSE 94 98  BUN 8 5*  CREATININE 0.92 0.84  CALCIUM 9.0 8.3*  MG  --  1.9   Liver Function Tests: No results for input(s): AST, ALT, ALKPHOS, BILITOT, PROT, ALBUMIN in the last 168 hours. No results for input(s): LIPASE, AMYLASE in the last 168 hours. No results for input(s): AMMONIA in  the last 168 hours. CBC: Recent Labs  Lab 04/17/17 1639 04/18/17 0303 04/19/17 0453 04/20/17 0506  WBC 8.8 8.5 8.1 8.0  NEUTROABS 5.5  --   --   --   HGB 10.3* 9.5* 9.5* 9.8*  HCT 33.2* 30.2* 31.4* 31.6*  MCV 84.5 82.7 84.6 84.9  PLT 349 306 347 356   Cardiac Enzymes: Recent Labs  Lab 04/17/17 1639  TROPONINI 0.07*   BNP: BNP (last 3 results) Recent Labs    04/17/17 1639  BNP 32.0    ProBNP (last 3 results) No results for input(s): PROBNP in the last 8760 hours.  CBG: No results for input(s): GLUCAP in the last 168 hours.     Signed:  Albertine Grates MD, PhD  [facility] 04/20/2017, 5:01 PM

## 2017-04-23 DIAGNOSIS — I10 Essential (primary) hypertension: Secondary | ICD-10-CM | POA: Diagnosis not present

## 2017-04-23 DIAGNOSIS — I2699 Other pulmonary embolism without acute cor pulmonale: Secondary | ICD-10-CM | POA: Diagnosis not present

## 2017-04-23 DIAGNOSIS — I119 Hypertensive heart disease without heart failure: Secondary | ICD-10-CM | POA: Diagnosis not present

## 2017-04-23 DIAGNOSIS — Z136 Encounter for screening for cardiovascular disorders: Secondary | ICD-10-CM | POA: Diagnosis not present

## 2017-04-23 DIAGNOSIS — Z01118 Encounter for examination of ears and hearing with other abnormal findings: Secondary | ICD-10-CM | POA: Diagnosis not present

## 2017-04-23 DIAGNOSIS — Z0000 Encounter for general adult medical examination without abnormal findings: Secondary | ICD-10-CM | POA: Diagnosis not present

## 2017-04-23 DIAGNOSIS — Z131 Encounter for screening for diabetes mellitus: Secondary | ICD-10-CM | POA: Diagnosis not present

## 2017-04-23 DIAGNOSIS — K219 Gastro-esophageal reflux disease without esophagitis: Secondary | ICD-10-CM | POA: Diagnosis not present

## 2017-04-30 DIAGNOSIS — R740 Nonspecific elevation of levels of transaminase and lactic acid dehydrogenase [LDH]: Secondary | ICD-10-CM | POA: Diagnosis not present

## 2017-04-30 DIAGNOSIS — I10 Essential (primary) hypertension: Secondary | ICD-10-CM | POA: Diagnosis not present

## 2017-04-30 DIAGNOSIS — I119 Hypertensive heart disease without heart failure: Secondary | ICD-10-CM | POA: Diagnosis not present

## 2017-04-30 DIAGNOSIS — I2699 Other pulmonary embolism without acute cor pulmonale: Secondary | ICD-10-CM | POA: Diagnosis not present

## 2017-05-03 DIAGNOSIS — N8502 Endometrial intraepithelial neoplasia [EIN]: Secondary | ICD-10-CM | POA: Diagnosis not present

## 2017-05-03 DIAGNOSIS — Z3202 Encounter for pregnancy test, result negative: Secondary | ICD-10-CM | POA: Diagnosis not present

## 2017-05-03 DIAGNOSIS — Z30430 Encounter for insertion of intrauterine contraceptive device: Secondary | ICD-10-CM | POA: Diagnosis not present

## 2017-05-03 DIAGNOSIS — Z9884 Bariatric surgery status: Secondary | ICD-10-CM | POA: Diagnosis not present

## 2017-05-03 DIAGNOSIS — N8500 Endometrial hyperplasia, unspecified: Secondary | ICD-10-CM | POA: Diagnosis not present

## 2017-05-14 ENCOUNTER — Encounter: Payer: BLUE CROSS/BLUE SHIELD | Attending: Surgery | Admitting: Registered"

## 2017-05-14 ENCOUNTER — Encounter: Payer: Self-pay | Admitting: Registered"

## 2017-05-14 DIAGNOSIS — Z6841 Body Mass Index (BMI) 40.0 and over, adult: Secondary | ICD-10-CM | POA: Diagnosis not present

## 2017-05-14 DIAGNOSIS — Z713 Dietary counseling and surveillance: Secondary | ICD-10-CM | POA: Diagnosis not present

## 2017-05-14 DIAGNOSIS — E669 Obesity, unspecified: Secondary | ICD-10-CM

## 2017-05-14 NOTE — Progress Notes (Signed)
Follow-up visit: 8 Weeks Post-Operative Sleeve Gastrectomy Surgery  Medical Nutrition Therapy:  Appt start time: 4:50 end time:  5:35.  Primary concerns today: Post-operative Bariatric Surgery Nutrition Management.  Non scale victories: none stated  Surgery date: 03/20/2017 Surgery type: Sleeve [facility]: [facility].4 Weight today: 363.0 Weight change: 18.4 lbs loss from 381.4 (04/03/2017) Total weight lost: 39.4 lbs Weight loss goal: none stated   TANITA  BODY COMP RESULTS  04/03/2017 05/14/2017   BMI (kg/m^2) 53.2 50.8   Fat Mass (lbs) 236.0 207.0   Fat Free Mass (lbs) 145.4 156.0   Total Body Water (lbs) N/A 118.6   Pt states she had a blood clot that started in her leg and popped off and went into both of her lungs. Pt states she has to have do injections for a month. Pt states she had collard greens because she did not plan well in returning to work today after being away from work for 2 months. Pt states she knows she shouldn't be eating greens due to current medications but that's what she chose to eat.  Pt states she does not track her protein. Pt states she gets full and has hiccups.   Preferred Learning Style:   No preference indicated   Learning Readiness:   Not ready  Contemplating  Ready  Change in progress  24-hr recall: B (AM): protein shake (30g) or 1/2 greek yogurt (7.5g) Snk (AM): none  L (PM): grilled chicken strip (7g), collard greens Snk (PM): none  D (PM): fish/jumbo shrimp (7-14g) Snk (PM): sugar-free popsicle   Fluid intake: crystal light with water, Twist, sugar-free popsicle; 64+ ounces Estimated total protein intake: ~30g  Medications: See list; added Lovenox injections Supplementation: Centrum + 3 calcium   Using straws: no Drinking while eating: no Having you been chewing well: yes Chewing/swallowing difficulties: no Changes in vision: no Changes to mood/headaches: no Hair loss/Changes to skin/Changes to nails: no, no,  no Any difficulty focusing or concentrating: no Sweating: no Dizziness/Lightheaded: no Palpitations: yes, when she was not getting enough fluid  Carbonated beverages: no N/V/D/C/GAS: no, no, no, no, no Abdominal Pain: no Dumping syndrome: no Last Lap-Band fill: N/A  Recent physical activity:  Walking 40 min, 3 days/week  Progress Towards Goal(s):  In progress.  Handouts given during visit include:  none   Nutritional Diagnosis:  [place]-5.7.1 Inadequate protein intake As related to bariatric post-op recommendations.  As evidenced by pt report of consuming less than 60 grams of protein.    Intervention:  Nutrition education and counseling. Pt was educated and counseled on the importance of meeting protein goals daily, tracking protein, and trying to stay with the recommended diet phase.  Goals: - Try ProCare Health capsule as multivitamin option and pour into yogurt.  - Track protein intake and aiming for at least 60 grams a day. You can use MyFitnessPal.  - Try to eat every 3-5 hours.  - Pack protein-rich items for work. See handout.   Teaching Method Utilized:  Visual Auditory Hands on  Barriers to learning/adherence to lifestyle change: work-life balance  Demonstrated degree of understanding via:  Teach Back   Monitoring/Evaluation:  Dietary intake, exercise, lap band fills, and body weight. Follow up in 2-3 weeks for 10-11 week post-op visit.

## 2017-05-14 NOTE — Patient Instructions (Addendum)
-   Try ProCare Health capsule as multivitamin option and pour into yogurt.   - Track protein intake and aiming for at least 60 grams a day. You can use MyFitnessPal.   - Try to eat every 3-5 hours.   - Pack protein-rich items for work. See handout.

## 2017-06-01 DIAGNOSIS — R740 Nonspecific elevation of levels of transaminase and lactic acid dehydrogenase [LDH]: Secondary | ICD-10-CM | POA: Diagnosis not present

## 2017-06-01 DIAGNOSIS — I119 Hypertensive heart disease without heart failure: Secondary | ICD-10-CM | POA: Diagnosis not present

## 2017-06-01 DIAGNOSIS — I2699 Other pulmonary embolism without acute cor pulmonale: Secondary | ICD-10-CM | POA: Diagnosis not present

## 2017-06-01 DIAGNOSIS — I10 Essential (primary) hypertension: Secondary | ICD-10-CM | POA: Diagnosis not present

## 2017-06-06 ENCOUNTER — Encounter: Payer: BLUE CROSS/BLUE SHIELD | Attending: Surgery | Admitting: Registered"

## 2017-06-06 ENCOUNTER — Encounter: Payer: Self-pay | Admitting: Registered"

## 2017-06-06 DIAGNOSIS — Z6841 Body Mass Index (BMI) 40.0 and over, adult: Secondary | ICD-10-CM | POA: Insufficient documentation

## 2017-06-06 DIAGNOSIS — Z713 Dietary counseling and surveillance: Secondary | ICD-10-CM | POA: Insufficient documentation

## 2017-06-06 DIAGNOSIS — E669 Obesity, unspecified: Secondary | ICD-10-CM

## 2017-06-06 NOTE — Patient Instructions (Addendum)
Goals:  Follow Phase 3B: High Protein + Non-Starchy Vegetables  Eat 3-6 small meals/snacks, every 3-5 hrs  Increase lean protein foods to meet 60g goal  Increase fluid intake to 64oz +  Avoid drinking 15 minutes before, during and 30 minutes after eating  Aim for >30 min of physical activity daily  - Aim to eat what you can in 20-30 minutes and come back to the rest for snack or meal.

## 2017-06-06 NOTE — Progress Notes (Signed)
Follow-up visit: 3 Months Post-Operative Sleeve Gastrectomy Surgery  Medical Nutrition Therapy:  Appt start time: 8:50 end time: 9:30  Primary concerns today: Post-operative Bariatric Surgery Nutrition Management.  Non scale victories: none stated  Surgery date: 03/20/2017 Surgery type: Sleeve Start weight at [facility]: 402.4 Weight today: 351.3 (pt requested regular scale) Weight change: 11.7 lbs loss from 363.0 (05/14/2017) Total weight lost: 51.1 lbs Weight loss goal: none stated   TANITA  BODY COMP RESULTS  04/03/2017 05/14/2017 06/06/2017   BMI (kg/m^2) 53.2 50.8 Pt requested regular scale   Fat Mass (lbs) 236.0 207.0    Fat Free Mass (lbs) 145.4 156.0    Total Body Water (lbs) N/A 118.6    Pt states she has been doing well with protein intake, tolerating most food items. Pt states her tastes buds are different and gets tired of some foods after chewing them for a while. Pt states she picks and eats on breakfast from 9-10:30am. Pt states she coupons and walks when bored instead of eating. Pt states coworkers help hold her accountable ad walk with her during breaks. Pt states she likes mustard more than mayo now. Pt states she wants to have some no-sugar added yogurt on occasions with her family (0g fat, 6g sugar, 3g protein per 4 oz). Pt states she she has been doing well with fluid intake. Pt states she's using a capsule bariatric multivitamin and pouring it into yogurt.   Pt states she does not track her protein and depends on if she feels like she has had enough protein or not. Pt states she gets full and has hiccups.   Preferred Learning Style:   No preference indicated   Learning Readiness:   Ready  Change in progress  24-hr recall: B (AM): 2 eggs (12g), cheese (6g), [place] sausage (6g)  Snk (AM): nuts (7g) or pickle or pork rinds (8g) or 1/2 protein bar  L (PM): tuna + pickle or grilled chicken strip (7g), collard greens Snk (PM): popcorn + cheese (6g) D (PM): 3 oz  grilled chicken drummettes or fish/jumbo shrimp (7-14g) Snk (PM): sugar-free popsicle   Fluid intake: tea (32 oz), crystal light with water (64 oz), Twist, sugar-free popsicle; 64+ ounces Estimated total protein intake: ~60+ grams  Medications: See list; added Lovenox injections Supplementation: ProCare MVI capsules in yogurt + 3 calcium   Using straws: no Drinking while eating: no Having you been chewing well: yes Chewing/swallowing difficulties: no Changes in vision: no Changes to mood/headaches: no Hair loss/Changes to skin/Changes to nails: no, no, no Any difficulty focusing or concentrating: no Sweating: no Dizziness/Lightheaded: no Palpitations: no Carbonated beverages: no N/V/D/C/GAS: no, no, no, yes, no Abdominal Pain: no Dumping syndrome: no Last Lap-Band fill: N/A  Recent physical activity:  Walking 2 mi/day, 5 days/week  Progress Towards Goal(s):  In progress.  Handouts given during visit include:  Phase IV: High Protein + NS vegetables   Nutritional Diagnosis:  [place]-3.3 Overweight/obesity related to past poor dietary habits and physical inactivity as evidenced by patient w/ recent sleeve gastrectomy surgery following dietary guidelines for continued weight loss.    Intervention:  Nutrition education and counseling. Pt was educated and counseled on the importance of tracking protein, having meal/snack times tp prevent grazing, eating snacks between meals to carry her over, and the next diet phase.  Goals:  Follow Phase 3B: High Protein + Non-Starchy Vegetables  Eat 3-6 small meals/snacks, every 3-5 hrs  Increase lean protein foods to meet 60g goal  Increase fluid  intake to 64oz +  Avoid drinking 15 minutes before, during and 30 minutes after eating  Aim for >30 min of physical activity daily - Aim to eat what you can in 20-30 minutes and come back to the rest for snack or meal. .   Teaching Method Utilized:  Visual Auditory Hands on  Barriers to  learning/adherence to lifestyle change: work-life balance  Demonstrated degree of understanding via:  Teach Back   Monitoring/Evaluation:  Dietary intake, exercise, lap band fills, and body weight. Follow up in 3 months for 6 month post-op visit.

## 2017-07-13 DIAGNOSIS — I2699 Other pulmonary embolism without acute cor pulmonale: Secondary | ICD-10-CM | POA: Diagnosis not present

## 2017-07-13 DIAGNOSIS — I119 Hypertensive heart disease without heart failure: Secondary | ICD-10-CM | POA: Diagnosis not present

## 2017-07-13 DIAGNOSIS — I10 Essential (primary) hypertension: Secondary | ICD-10-CM | POA: Diagnosis not present

## 2017-07-13 DIAGNOSIS — K219 Gastro-esophageal reflux disease without esophagitis: Secondary | ICD-10-CM | POA: Diagnosis not present

## 2017-07-31 DIAGNOSIS — K219 Gastro-esophageal reflux disease without esophagitis: Secondary | ICD-10-CM | POA: Diagnosis not present

## 2017-07-31 DIAGNOSIS — I10 Essential (primary) hypertension: Secondary | ICD-10-CM | POA: Diagnosis not present

## 2017-07-31 DIAGNOSIS — I119 Hypertensive heart disease without heart failure: Secondary | ICD-10-CM | POA: Diagnosis not present

## 2017-07-31 DIAGNOSIS — I2699 Other pulmonary embolism without acute cor pulmonale: Secondary | ICD-10-CM | POA: Diagnosis not present

## 2017-08-10 ENCOUNTER — Encounter: Payer: Self-pay | Admitting: Hematology & Oncology

## 2017-08-23 ENCOUNTER — Other Ambulatory Visit: Payer: Self-pay | Admitting: Family

## 2017-08-23 DIAGNOSIS — I2699 Other pulmonary embolism without acute cor pulmonale: Secondary | ICD-10-CM

## 2017-08-24 ENCOUNTER — Encounter: Payer: Self-pay | Admitting: Family

## 2017-08-24 ENCOUNTER — Inpatient Hospital Stay: Payer: BLUE CROSS/BLUE SHIELD | Attending: Family | Admitting: Family

## 2017-08-24 ENCOUNTER — Other Ambulatory Visit: Payer: Self-pay

## 2017-08-24 ENCOUNTER — Inpatient Hospital Stay: Payer: BLUE CROSS/BLUE SHIELD

## 2017-08-24 VITALS — BP 115/62 | HR 73 | Temp 97.8°F | Resp 18 | Wt 318.0 lb

## 2017-08-24 DIAGNOSIS — Z9884 Bariatric surgery status: Secondary | ICD-10-CM | POA: Insufficient documentation

## 2017-08-24 DIAGNOSIS — Z79899 Other long term (current) drug therapy: Secondary | ICD-10-CM | POA: Insufficient documentation

## 2017-08-24 DIAGNOSIS — I2699 Other pulmonary embolism without acute cor pulmonale: Secondary | ICD-10-CM | POA: Insufficient documentation

## 2017-08-24 DIAGNOSIS — I82401 Acute embolism and thrombosis of unspecified deep veins of right lower extremity: Secondary | ICD-10-CM | POA: Diagnosis not present

## 2017-08-24 DIAGNOSIS — D649 Anemia, unspecified: Secondary | ICD-10-CM | POA: Diagnosis not present

## 2017-08-24 DIAGNOSIS — D508 Other iron deficiency anemias: Secondary | ICD-10-CM

## 2017-08-24 DIAGNOSIS — Z7901 Long term (current) use of anticoagulants: Secondary | ICD-10-CM | POA: Diagnosis not present

## 2017-08-24 DIAGNOSIS — Z09 Encounter for follow-up examination after completed treatment for conditions other than malignant neoplasm: Secondary | ICD-10-CM | POA: Diagnosis not present

## 2017-08-24 DIAGNOSIS — I10 Essential (primary) hypertension: Secondary | ICD-10-CM | POA: Diagnosis not present

## 2017-08-24 LAB — CMP (CANCER CENTER ONLY)
ALBUMIN: 3.5 g/dL (ref 3.5–5.0)
ALK PHOS: 62 U/L (ref 38–126)
ALT: 18 U/L (ref 0–44)
AST: 17 U/L (ref 15–41)
Anion gap: 9 (ref 5–15)
BUN: 13 mg/dL (ref 6–20)
CHLORIDE: 103 mmol/L (ref 98–111)
CO2: 25 mmol/L (ref 22–32)
CREATININE: 0.94 mg/dL (ref 0.44–1.00)
Calcium: 9.5 mg/dL (ref 8.9–10.3)
GFR, Est AFR Am: >60 mL/min (ref >60)
GFR, Estimated: >60 mL/min (ref >60)
GLUCOSE: 86 mg/dL (ref 70–99)
Potassium: 3.7 mmol/L (ref 3.5–5.1)
Sodium: 137 mmol/L (ref 135–145)
Total Bilirubin: 0.6 mg/dL (ref 0.3–1.2)
Total Protein: 7.7 g/dL (ref 6.5–8.1)

## 2017-08-24 LAB — IRON AND TIBC
IRON: 53 ug/dL (ref 41–142)
Saturation Ratios: 15 % — ABNORMAL LOW (ref 21–57)
TIBC: 365 ug/dL (ref 236–444)
UIBC: 312 ug/dL

## 2017-08-24 LAB — ANTITHROMBIN III: AntiThromb III Func: 82 % (ref 75–120)

## 2017-08-24 LAB — D-DIMER, QUANTITATIVE (NOT AT ARMC): D DIMER QUANT: 0.38 ug{FEU}/mL (ref 0.00–0.50)

## 2017-08-24 LAB — CBC WITH DIFFERENTIAL (CANCER CENTER ONLY)
BASOS PCT: 0 %
Basophils Absolute: 0 10*3/uL (ref 0.0–0.1)
EOS PCT: 1 %
Eosinophils Absolute: 0.1 10*3/uL (ref 0.0–0.5)
HCT: 34.4 % — ABNORMAL LOW (ref 34.8–46.6)
Hemoglobin: 10.6 g/dL — ABNORMAL LOW (ref 11.6–15.9)
LYMPHS ABS: 1.9 10*3/uL (ref 0.9–3.3)
Lymphocytes Relative: 26 %
MCH: 26 pg (ref 26.0–34.0)
MCHC: 30.8 g/dL — AB (ref 32.0–36.0)
MCV: 84.5 fL (ref 81.0–101.0)
MONO ABS: 0.7 10*3/uL (ref 0.1–0.9)
MONOS PCT: 9 %
Neutro Abs: 4.7 10*3/uL (ref 1.5–6.5)
Neutrophils Relative %: 64 %
PLATELETS: 354 10*3/uL (ref 145–400)
RBC: 4.07 MIL/uL (ref 3.70–5.32)
RDW: 16.5 % — AB (ref 11.1–15.7)
WBC Count: 7.3 10*3/uL (ref 3.9–10.0)

## 2017-08-24 LAB — FERRITIN: FERRITIN: 26 ng/mL (ref 11–307)

## 2017-08-24 LAB — LACTATE DEHYDROGENASE: LDH: 154 U/L (ref 98–192)

## 2017-08-24 MED ORDER — RIVAROXABAN 20 MG PO TABS: 20.0000 mg | ORAL_TABLET | Freq: Every day | ORAL | 6 refills | Status: DC

## 2017-08-24 NOTE — Progress Notes (Signed)
Hematology/Oncology Consultation   Name: Ashley Miller      MRN: [id number]    Location: Room/bed info not found  Date: 08/24/2017 Time:9:52 AM   REFERRING PHYSICIAN: Jackie PlumGeorge Osei-Bonsu, MD  REASON FOR CONSULT: Bilateral PE and right lower extremity DVT   DIAGNOSIS: Bilateral PE and right lower extremity DVT  HISTORY OF PRESENT ILLNESS: Ashley Miller is a very pleasant 37 yo Afric[place] American female diagnosed with her first thrombus in March of this year. She has bilateral PE with no evidence of right heart strain and DVT of the right lower extremity.  She is not a smoker, does not drink ETOH and has not been on any hormone based treatment.  She had gastric sleeve surgery in February and was sedentary afterwards. This certainly may have contributed to her developing thrombus.  She is currently on Lovenox BID and has had no issue with bleeding, no bruising or petechiae. She really does not like the injections and would like to transition to Xarelto.  Once she started treatment with an anticoagulant her symptoms resolved.  Prior to her surgery she had cysts on her ovary that caused heavy cycles. She had been on Megace and then had an IUD placed after her gastric sleeve procedure.  She had an aunt with an irregular heart rate and pacemaker with history of thrombus. She is on coumadin.  No personal or familial history of sickle cell.  She has 1 39 yo son and no history of miscarriage. No fever, chills, n/v, cough,r ash, dizziness, SOB, chest pain, palpitations, abdominal pain or changes in bowel or bladder habits.  She uses Mirilax as needed for constipation.  No swelling, tenderness, numbness or tingling in her extremities. No c/o pain.  No lymphadenopathy noted on her exam.  She has maintained a good appetite and is staying well hydrated. Her weight is down 86 lbs since surgery!  ROS: All other 10 point review of systems is negative.   PAST MEDICAL HISTORY:   Past Medical History:  Diagnosis  Date  . Hypertension     ALLERGIES: Allergies  Allergen Reactions  . Penicillins Nausea And Vomiting and Other (See Comments)    Yeast infection Has patient had a PCN reaction causing immediate rash, facial/tongue/throat swelling, SOB or lightheadedness with hypotension: Yes Has patient had a PCN reaction causing severe rash involving mucus membranes or skin necrosis: No Has patient had a PCN reaction that required hospitalization: No Has patient had a PCN reaction occurring within the last 10 years: No If all of the above answers are "NO", then may proceed with Cephalosporin use.       MEDICATIONS:  Current Outpatient Medications on File Prior to Visit  Medication Sig Dispense Refill  . hydrochlorothiazide (HYDRODIURIL) 12.5 MG tablet Take 12.5 mg by mouth daily.    . calcium carbonate (TUMS - DOSED IN MG ELEMENTAL CALCIUM) 500 MG chewable tablet Chew 1 tablet by mouth daily as needed for indigestion or heartburn.    . Multiple Vitamins-Minerals (CENTRUM MULTIGUMMIES PO) Take 1 each by mouth daily.     No current facility-administered medications on file prior to visit.      PAST SURGICAL HISTORY Past Surgical History:  Procedure Laterality Date  . DILATION AND CURETTAGE OF UTERUS    . LAPAROSCOPIC GASTRIC SLEEVE RESECTION N/A 03/20/2017   Procedure: LAPAROSCOPIC GASTRIC SLEEVE RESECTION WITH UPPER ENDO;  Surgeon: Luretha MurphyMartin, Matthew, MD;  Location: [facility];  Service: General;  Laterality: N/A;    FAMILY HISTORY: Family History  Problem Relation Age of Onset  . Diabetes Other   . Hypertension Other   . Heart disease Other     SOCIAL HISTORY:  reports that she has never smoked. She has never used smokeless tobacco. She reports that she does not drink alcohol or use drugs.  PERFORMANCE STATUS: The patient's performance status is 1 - Symptomatic but completely ambulatory  PHYSICAL EXAM: Most Recent Vital Signs: Blood pressure 115/62, pulse 73, temperature 97.8 F (36.6 C),  temperature source Oral, resp. rate 18, weight (!) 318 lb (144.2 kg), SpO2 98 %. BP 115/62 (BP Location: Left Arm, Patient Position: Sitting)   Pulse 73   Temp 97.8 F (36.6 C) (Oral)   Resp 18   Wt (!) 318 lb (144.2 kg)   SpO2 98%   BMI 44.35 kg/m   General Appearance:    Alert, cooperative, no distress, appears stated age  Head:    Normocephalic, without obvious abnormality, atraumatic  Eyes:    PERRL, conjunctiva/corneas clear, EOM's intact, fundi    benign, both eyes  Ears:    Normal TM's and external ear canals, both ears  Nose:   Nares normal, septum midline, mucosa normal, no drainage    or sinus tenderness  Throat:   Lips, mucosa, and tongue normal; teeth and gums normal  Neck:   Supple, symmetrical, trachea midline, no adenopathy;    thyroid:  no enlargement/tenderness/nodules; no carotid   bruit or JVD  Back:     Symmetric, no curvature, ROM normal, no CVA tenderness  Lungs:     Clear to auscultation bilaterally, respirations unlabored  Chest Wall:    No tenderness or deformity   Heart:    Regular rate and rhythm, S1 and S2 normal, no murmur, rub   or gallop     Abdomen:     Soft, non-tender, bowel sounds active all four quadrants,    no masses, no organomegaly        Extremities:   Extremities normal, atraumatic, no cyanosis or edema  Pulses:   2+ and symmetric all extremities  Skin:   Skin color, texture, turgor normal, no rashes or lesions  Lymph nodes:   Cervical, supraclavicular, and axillary nodes normal  Neurologic:   CNII-XII intact, normal strength, sensation and reflexes    throughout    LABORATORY DATA:  Results for orders placed or performed in visit on 08/24/17 (from the past 48 hour(s))  CBC with Differential (Cancer Center Only)     Status: Abnormal   Collection Time: 08/24/17  8:40 AM  Result Value Ref Range   WBC Count 7.3 3.9 - 10.0 K/uL   RBC 4.07 3.70 - 5.32 MIL/uL   Hemoglobin 10.6 (L) 11.6 - 15.9 g/dL   HCT 16.1 (L) 09.6 - 04.5 %   MCV  84.5 81.0 - 101.0 fL   MCH 26.0 26.0 - 34.0 pg   MCHC 30.8 (L) 32.0 - 36.0 g/dL   RDW 40.9 (H) 81.1 - 91.4 %   Platelet Count 354 145 - 400 K/uL   Neutrophils Relative % 64 %   Neutro Abs 4.7 1.5 - 6.5 K/uL   Lymphocytes Relative 26 %   Lymphs Abs 1.9 0.9 - 3.3 K/uL   Monocytes Relative 9 %   Monocytes Absolute 0.7 0.1 - 0.9 K/uL   Eosinophils Relative 1 %   Eosinophils Absolute 0.1 0.0 - 0.5 K/uL   Basophils Relative 0 %   Basophils Absolute 0.0 0.0 - 0.1 K/uL    Comment:  Performed at [facility] at [facility], [address] [place], [place] [address]      RADIOGRAPHY: No results found.      PATHOLOGY: None  ASSESSMENT/PLAN: Ashley Miller is a very pleasant 37 yo [place] American female bilateral pulmonary emboli without evidence of heart strain and right lower extremity DVT.  She is doing well and is asymptomatic at this time.  We will now have her transition from Lovenox to Xarelto. She will continue full dose Xarelto until March 2020 and then go to maintenance dosing for 1 year.  We will repeat her CT angio and right lower extremity US in 2 weeks and plan to see her back for follow-up in 2 months.   All questions were answered and she is in agreement with plan. She will contact our office with any questions or concerns. We can certainly see her sooner if needed.  She was discussed with and also seen by Dr. Myna Hidalgo and he is in agreement with the aforementioned.   Emeline Gins     Addendum:  I saw and examined the patient with Maralyn Sago.  I agree with the above assessment.  I suspect that we will not find an etiology for the thromboembolic disease.  However, I would not be surprised if there is a positive lupus anticoagulant.  That seems to be the most popular finding that we have these days.  We will have to see what her hypercoagulable studies show.  We did do some iron studies on her.  She is iron deficient.  She is somewhat anemic.  She  may benefit from some IV iron.  It has been 6 months that she had the thromboembolic event.  We really need to repeat her CT angiogram and also her Doppler of the legs to see if we have resolution of the thrombi/emboli.  I think she will need 1 full year of anticoagulation.  After that, she will need 1 year of maintenance anticoagulation.  We will have to see what the thrombophilic panel shows.  We spent about 40 minutes with her.  All the time was spent face-to-face counseling her and coordinating her care.  We answered all of her questions.  We will have her come back in another couple months.  She will have the CT angiogram and Dopplers before we see her back.  Christin Bach. MD

## 2017-08-25 LAB — PROTEIN C ACTIVITY: Protein C Activity: 120 % (ref 73–180)

## 2017-08-25 LAB — PROTEIN S, TOTAL: PROTEIN S AG TOTAL: 89 % (ref 60–150)

## 2017-08-25 LAB — LUPUS ANTICOAGULANT PANEL
DRVVT: 37.8 s (ref 0.0–47.0)
PTT Lupus Anticoagulant: 27.2 s (ref 0.0–51.9)

## 2017-08-25 LAB — PROTEIN S ACTIVITY: Protein S Activity: 78 % (ref 63–140)

## 2017-08-25 LAB — BETA-2-GLYCOPROTEIN I ABS, IGG/M/A
Beta-2 Glyco I IgG: <9 GPI IgG units (ref 0–20)
Beta-2-Glycoprotein I IgA: <9 GPI IgA units (ref 0–25)
Beta-2-Glycoprotein I IgM: <9 GPI IgM units (ref 0–32)

## 2017-08-26 LAB — CARDIOLIPIN ANTIBODIES, IGG, IGM, IGA
Anticardiolipin IgG: <9 GPL U/mL (ref 0–14)
Anticardiolipin IgM: <9 MPL U/mL (ref 0–12)

## 2017-08-27 LAB — HOMOCYSTEINE: Homocysteine: 14.4 umol/L (ref 0.0–15.0)

## 2017-08-28 LAB — PROTEIN C, TOTAL: Protein C, Total: 85 % (ref 60–150)

## 2017-08-28 LAB — PROTHROMBIN GENE MUTATION

## 2017-08-29 LAB — FACTOR 5 LEIDEN

## 2017-08-31 ENCOUNTER — Other Ambulatory Visit: Payer: Self-pay | Admitting: Family

## 2017-08-31 DIAGNOSIS — I82401 Acute embolism and thrombosis of unspecified deep veins of right lower extremity: Secondary | ICD-10-CM

## 2017-08-31 DIAGNOSIS — I2699 Other pulmonary embolism without acute cor pulmonale: Secondary | ICD-10-CM

## 2017-08-31 DIAGNOSIS — D508 Other iron deficiency anemias: Secondary | ICD-10-CM

## 2017-09-04 ENCOUNTER — Ambulatory Visit (HOSPITAL_BASED_OUTPATIENT_CLINIC_OR_DEPARTMENT_OTHER)
Admission: RE | Admit: 2017-09-04 | Discharge: 2017-09-04 | Disposition: A | Discharge status: Home / Self Care | Payer: BLUE CROSS/BLUE SHIELD | Source: Ambulatory Visit | Attending: Family | Admitting: Family

## 2017-09-04 DIAGNOSIS — Z7901 Long term (current) use of anticoagulants: Secondary | ICD-10-CM | POA: Insufficient documentation

## 2017-09-04 DIAGNOSIS — Z86718 Personal history of other venous thrombosis and embolism: Secondary | ICD-10-CM | POA: Diagnosis not present

## 2017-09-04 DIAGNOSIS — I82401 Acute embolism and thrombosis of unspecified deep veins of right lower extremity: Secondary | ICD-10-CM

## 2017-09-04 DIAGNOSIS — I82402 Acute embolism and thrombosis of unspecified deep veins of left lower extremity: Secondary | ICD-10-CM | POA: Diagnosis not present

## 2017-09-04 DIAGNOSIS — Z86711 Personal history of pulmonary embolism: Secondary | ICD-10-CM | POA: Insufficient documentation

## 2017-09-04 DIAGNOSIS — R918 Other nonspecific abnormal finding of lung field: Secondary | ICD-10-CM | POA: Insufficient documentation

## 2017-09-04 DIAGNOSIS — Z09 Encounter for follow-up examination after completed treatment for conditions other than malignant neoplasm: Secondary | ICD-10-CM | POA: Insufficient documentation

## 2017-09-04 DIAGNOSIS — I10 Essential (primary) hypertension: Secondary | ICD-10-CM | POA: Diagnosis not present

## 2017-09-04 DIAGNOSIS — I2699 Other pulmonary embolism without acute cor pulmonale: Secondary | ICD-10-CM

## 2017-09-04 DIAGNOSIS — Z9884 Bariatric surgery status: Secondary | ICD-10-CM | POA: Diagnosis not present

## 2017-09-04 MED ORDER — IOPAMIDOL (ISOVUE-370) INJECTION 76%
100.0000 mL | Freq: Once | INTRAVENOUS | Status: AC | PRN
  Administered 2017-09-04: 100 mL via INTRAVENOUS

## 2017-09-05 ENCOUNTER — Telehealth: Payer: Self-pay | Admitting: Family

## 2017-09-05 NOTE — Telephone Encounter (Signed)
Called patient and went over her CT angio and lower extremity US results which were negative for thrombus. Also went over her lab work and she will try taking gentle iron of the counter supplement for her IDA. We will recheck labs at follow-up in September.

## 2017-09-11 ENCOUNTER — Encounter: Payer: BLUE CROSS/BLUE SHIELD | Attending: Surgery | Admitting: Skilled Nursing Facility1

## 2017-09-11 DIAGNOSIS — Z713 Dietary counseling and surveillance: Secondary | ICD-10-CM | POA: Diagnosis not present

## 2017-09-11 DIAGNOSIS — Z6841 Body Mass Index (BMI) 40.0 and over, adult: Secondary | ICD-10-CM | POA: Insufficient documentation

## 2017-09-11 DIAGNOSIS — E669 Obesity, unspecified: Secondary | ICD-10-CM

## 2017-09-12 NOTE — Progress Notes (Signed)
Follow-up visit:  Post-Operative sleeve Surgery  Medical Nutrition Therapy:  Appt start time: 6:00pm end time:  7:00pm  Primary concerns today: Post-operative Bariatric Surgery Nutrition Management 6 Month Post-Op Class  Surgery date: 03/20/2017 Surgery type: Sleeve [facility] Weight today: pt arrived too late  TANITA  BODY COMP RESULTS  04/03/2017 05/14/2017 06/06/2017   BMI (kg/m^2) 53.2 50.8 Pt requested regular scale   Fat Mass (lbs) 236.0 207.0    Fat Free Mass (lbs) 145.4 156.0    Total Body Water (lbs) N/A 118.6     Information Reviewed/ Discussed During Appointment: -Review of composition scale numbers -Fluid requirements (64-100 ounces) -Protein requirements (60-80g) -Strategies for tolerating diet -Advancement of diet to include Starchy vegetables -Barriers to inclusion of new foods -Inclusion of appropriate multivitamin and calcium supplements  -Exercise recommendations   Fluid intake: 64+  Medications: see list Supplementation: yes  Using straws: no Drinking while eating: no Having you been chewing well:no Chewing/swallowing difficulties: no Changes in vision: no Changes to mood/headaches: no Hair loss/Cahnges to skin/Changes to nails: no Any difficulty focusing or concentrating: no Sweating: no Dizziness/Lightheaded:  Palpitations: no  Carbonated beverages: no N/V/D/C/GAS: no Abdominal Pain: no Dumping syndrome: no  Recent physical activity:  Gym: will look into [employer]  Progress Towards Goal(s):  In progress.  Handouts given during visit include:  Phase V diet Progression   Goals Sheet  The Benefits of Exercise are endless.....  Support Group Topics  Pt Chosen Goals:  I will chew food until it is very small every time I eat by 10/14/2017 I will say 2 nice things about myself by 10/14/2017 I will join [employer]T by 10-14-2017  Teaching Method Utilized:  Visual Auditory Hands on   Demonstrated degree of understanding via:   Teach Back   Monitoring/Evaluation:  Dietary intake, exercise, and body weight. Follow up in 3 months for 9 month post-op visit.

## 2017-10-15 DIAGNOSIS — Z01419 Encounter for gynecological examination (general) (routine) without abnormal findings: Secondary | ICD-10-CM | POA: Diagnosis not present

## 2017-10-15 DIAGNOSIS — Z1151 Encounter for screening for human papillomavirus (HPV): Secondary | ICD-10-CM | POA: Diagnosis not present

## 2017-10-15 DIAGNOSIS — Z30431 Encounter for routine checking of intrauterine contraceptive device: Secondary | ICD-10-CM | POA: Diagnosis not present

## 2017-10-25 ENCOUNTER — Inpatient Hospital Stay: Payer: BLUE CROSS/BLUE SHIELD

## 2017-10-25 ENCOUNTER — Inpatient Hospital Stay: Payer: BLUE CROSS/BLUE SHIELD | Admitting: Hematology & Oncology

## 2017-10-29 DIAGNOSIS — I2699 Other pulmonary embolism without acute cor pulmonale: Secondary | ICD-10-CM | POA: Diagnosis not present

## 2017-10-29 DIAGNOSIS — I10 Essential (primary) hypertension: Secondary | ICD-10-CM | POA: Diagnosis not present

## 2017-10-29 DIAGNOSIS — J00 Acute nasopharyngitis [common cold]: Secondary | ICD-10-CM | POA: Diagnosis not present

## 2017-10-29 DIAGNOSIS — I119 Hypertensive heart disease without heart failure: Secondary | ICD-10-CM | POA: Diagnosis not present

## 2017-11-14 DIAGNOSIS — I10 Essential (primary) hypertension: Secondary | ICD-10-CM | POA: Diagnosis not present

## 2017-11-14 DIAGNOSIS — R55 Syncope and collapse: Secondary | ICD-10-CM | POA: Diagnosis not present

## 2017-11-14 DIAGNOSIS — E860 Dehydration: Secondary | ICD-10-CM | POA: Diagnosis not present

## 2017-11-14 DIAGNOSIS — R9431 Abnormal electrocardiogram [ECG] [EKG]: Secondary | ICD-10-CM | POA: Diagnosis not present

## 2017-11-14 DIAGNOSIS — Z6841 Body Mass Index (BMI) 40.0 and over, adult: Secondary | ICD-10-CM | POA: Diagnosis not present

## 2017-11-14 DIAGNOSIS — D649 Anemia, unspecified: Secondary | ICD-10-CM | POA: Diagnosis not present

## 2017-11-14 DIAGNOSIS — Z86711 Personal history of pulmonary embolism: Secondary | ICD-10-CM | POA: Diagnosis not present

## 2017-11-14 DIAGNOSIS — Z9884 Bariatric surgery status: Secondary | ICD-10-CM | POA: Diagnosis not present

## 2017-11-14 DIAGNOSIS — Z7901 Long term (current) use of anticoagulants: Secondary | ICD-10-CM | POA: Diagnosis not present

## 2017-11-14 DIAGNOSIS — Z79899 Other long term (current) drug therapy: Secondary | ICD-10-CM | POA: Diagnosis not present

## 2017-11-14 DIAGNOSIS — I951 Orthostatic hypotension: Secondary | ICD-10-CM | POA: Diagnosis not present

## 2017-11-15 DIAGNOSIS — I10 Essential (primary) hypertension: Secondary | ICD-10-CM | POA: Diagnosis not present

## 2017-11-15 DIAGNOSIS — K219 Gastro-esophageal reflux disease without esophagitis: Secondary | ICD-10-CM | POA: Diagnosis not present

## 2017-11-15 DIAGNOSIS — M25561 Pain in right knee: Secondary | ICD-10-CM | POA: Diagnosis not present

## 2017-11-15 DIAGNOSIS — I2699 Other pulmonary embolism without acute cor pulmonale: Secondary | ICD-10-CM | POA: Diagnosis not present

## 2017-11-15 DIAGNOSIS — I119 Hypertensive heart disease without heart failure: Secondary | ICD-10-CM | POA: Diagnosis not present

## 2017-11-20 DIAGNOSIS — I2699 Other pulmonary embolism without acute cor pulmonale: Secondary | ICD-10-CM | POA: Diagnosis not present

## 2017-11-20 DIAGNOSIS — I119 Hypertensive heart disease without heart failure: Secondary | ICD-10-CM | POA: Diagnosis not present

## 2017-11-20 DIAGNOSIS — I10 Essential (primary) hypertension: Secondary | ICD-10-CM | POA: Diagnosis not present

## 2017-11-20 DIAGNOSIS — K219 Gastro-esophageal reflux disease without esophagitis: Secondary | ICD-10-CM | POA: Diagnosis not present

## 2017-11-29 ENCOUNTER — Ambulatory Visit: Payer: BLUE CROSS/BLUE SHIELD | Admitting: Hematology & Oncology

## 2017-11-29 ENCOUNTER — Other Ambulatory Visit: Payer: BLUE CROSS/BLUE SHIELD

## 2017-12-13 ENCOUNTER — Encounter: Payer: BLUE CROSS/BLUE SHIELD | Attending: Surgery | Admitting: Registered"

## 2017-12-13 ENCOUNTER — Encounter: Payer: Self-pay | Admitting: Registered"

## 2017-12-13 DIAGNOSIS — Z713 Dietary counseling and surveillance: Secondary | ICD-10-CM | POA: Diagnosis not present

## 2017-12-13 DIAGNOSIS — E669 Obesity, unspecified: Secondary | ICD-10-CM

## 2017-12-13 DIAGNOSIS — Z6841 Body Mass Index (BMI) 40.0 and over, adult: Secondary | ICD-10-CM | POA: Insufficient documentation

## 2017-12-13 NOTE — Patient Instructions (Addendum)
-   Take 3 TUMS.   - Aim to have vegetables lunch and dinner.   - Can include fruit after eating protein and vegetables first.

## 2017-12-13 NOTE — Progress Notes (Signed)
Follow-up visit: 9 Months Post-Operative Sleeve Gastrectomy Surgery  Medical Nutrition Therapy:  Appt start time: 8:50 end time: 9:33  Primary concerns today: Post-operative Bariatric Surgery Nutrition Management.  Non scale victories: more active, enjoys being more active, no longer taking blood pressure medications, more comfortable when being active  Surgery date: 03/20/2017 Surgery type: Sleeve Start weight at [facility]: 402.4 Weight today: 295 Weight change: 56.3 lbs loss from 351.3 (06/06/2017) Total weight lost: 107.4 lbs Weight loss goal: develop a healthier lifestyle, to do more, decrease blood pressure pills  TANITA  BODY COMP RESULTS  04/03/2017 05/14/2017 06/06/2017 12/13/2017   BMI (kg/m^2) 53.2 50.8 Pt requested regular scale 41.1   Fat Mass (lbs) 236.0 207.0  154.2   Fat Free Mass (lbs) 145.4 156.0  140.8   Total Body Water (lbs) N/A 118.6  105.0   Pt states she had a situation when her blood pressure was too low. Pt states she passed out 3 times when using the restroom at night and paramedics were called. Pt states she sprained leg during that time.   Pt reports non-scale victory of being more comfortable when walking around [place] this year compared to last year (prior to surgery). Pt states she realized she is drinking less water since its cold outside. Pt states she thinks she is overindulging with sweet tea; drinks sweet tea 30 minutes after eating lunch. Pt states she is planning to restart BELT once her leg is better.    Preferred Learning Style:   No preference indicated   Learning Readiness:   Ready  Change in progress  24-hr recall: B (AM): protein shake (30g) or 2 eggs (12g), cheese (6g), [place] sausage (6g)  Snk (AM): protein bar (5g) L (PM): chicfila-sandwich (28g) + cabbage (sometimes) Snk (PM): popcorn or carrots + ranch D (PM): [employer]'s-chili (21g) Snk (PM): none  Fluid intake: sweet tea (32 oz), crystal light with water (64 oz), Twist,  sugar-free popsicle; 64+ ounces Estimated total protein intake: ~60+ grams  Medications: See list; added Lovenox injections Supplementation: ProCare MVI capsules + 2 TUMS   Using straws: no Drinking while eating: no Having you been chewing well: yes Chewing/swallowing difficulties: no Changes in vision: no Changes to mood/headaches: no Hair loss/Changes to skin/Changes to nails: no, no, no Any difficulty focusing or concentrating: no Sweating: no Dizziness/Lightheaded: no Palpitations: no Carbonated beverages: no N/V/D/C/GAS: no, no, no, yes-taking milk of magnesia once a week, no Abdominal Pain: no Dumping syndrome: no Last Lap-Band fill: N/A  Recent physical activity:  Was attending [employer] until leg injury. Planning to attend at the end of Nov/beginning of Dec. Walking at work 30 min, 5 days/week  Progress Towards Goal(s):  In progress.  Handouts given during visit include:  Phase VI: High protein + vegetables + fruit   Nutritional Diagnosis:  [place]-3.3 Overweight/obesity related to past poor dietary habits and physical inactivity as evidenced by patient w/ recent sleeve gastrectomy surgery following dietary guidelines for continued weight loss.    Intervention:  Nutrition education and counseling. Pt was educated and counseled how meeting adequate calcium goals, ways to increase fiber intake, and how to incorporate fruit into regimen. Pt was in agreement with goals listed.  Goals: - Take 3 TUMS.  - Aim to have vegetables lunch and dinner.  - Can include fruit after eating protein and vegetables first.   Teaching Method Utilized:  Visual Auditory Hands on  Barriers to learning/adherence to lifestyle change: work-life balance  Demonstrated degree of understanding via:  Teach Back   Monitoring/Evaluation:  Dietary intake, exercise, lap band fills, and body weight. Follow up in 3 months for 12 month post-op visit.

## 2017-12-14 DIAGNOSIS — Z09 Encounter for follow-up examination after completed treatment for conditions other than malignant neoplasm: Secondary | ICD-10-CM | POA: Diagnosis not present

## 2017-12-18 ENCOUNTER — Ambulatory Visit: Payer: BLUE CROSS/BLUE SHIELD

## 2018-01-02 DIAGNOSIS — M25561 Pain in right knee: Secondary | ICD-10-CM | POA: Diagnosis not present

## 2018-01-18 DIAGNOSIS — S83241A Other tear of medial meniscus, current injury, right knee, initial encounter: Secondary | ICD-10-CM | POA: Diagnosis not present

## 2018-01-18 DIAGNOSIS — M25561 Pain in right knee: Secondary | ICD-10-CM | POA: Diagnosis not present

## 2018-01-18 DIAGNOSIS — M6281 Muscle weakness (generalized): Secondary | ICD-10-CM | POA: Diagnosis not present

## 2018-02-01 DIAGNOSIS — M25561 Pain in right knee: Secondary | ICD-10-CM | POA: Diagnosis not present

## 2018-02-01 DIAGNOSIS — M6281 Muscle weakness (generalized): Secondary | ICD-10-CM | POA: Diagnosis not present

## 2018-02-01 DIAGNOSIS — S83241A Other tear of medial meniscus, current injury, right knee, initial encounter: Secondary | ICD-10-CM | POA: Diagnosis not present

## 2018-02-19 DIAGNOSIS — I2699 Other pulmonary embolism without acute cor pulmonale: Secondary | ICD-10-CM | POA: Diagnosis not present

## 2018-02-19 DIAGNOSIS — I119 Hypertensive heart disease without heart failure: Secondary | ICD-10-CM | POA: Diagnosis not present

## 2018-02-19 DIAGNOSIS — K219 Gastro-esophageal reflux disease without esophagitis: Secondary | ICD-10-CM | POA: Diagnosis not present

## 2018-02-19 DIAGNOSIS — I10 Essential (primary) hypertension: Secondary | ICD-10-CM | POA: Diagnosis not present

## 2018-02-28 ENCOUNTER — Other Ambulatory Visit: Payer: Self-pay | Admitting: Family

## 2018-02-28 DIAGNOSIS — I82401 Acute embolism and thrombosis of unspecified deep veins of right lower extremity: Secondary | ICD-10-CM

## 2018-02-28 DIAGNOSIS — I2699 Other pulmonary embolism without acute cor pulmonale: Secondary | ICD-10-CM

## 2018-03-06 DIAGNOSIS — M25561 Pain in right knee: Secondary | ICD-10-CM | POA: Diagnosis not present

## 2018-03-06 DIAGNOSIS — S83241D Other tear of medial meniscus, current injury, right knee, subsequent encounter: Secondary | ICD-10-CM | POA: Diagnosis not present

## 2018-03-19 ENCOUNTER — Encounter: Payer: BLUE CROSS/BLUE SHIELD | Attending: Surgery | Admitting: Skilled Nursing Facility1

## 2018-03-19 DIAGNOSIS — E669 Obesity, unspecified: Secondary | ICD-10-CM | POA: Insufficient documentation

## 2018-03-21 NOTE — Progress Notes (Signed)
Bariatric Class:  Appt start time: 6:00 end time: 7:00  12 Month Post-Operative Nutrition Class  Patient was seen on 03/21/2018 for Post-Operative Nutrition education at [facility]   Pt states she has been struggling to meet her fluid recommendations: pt does not report any lightheadedness or dizziness   Surgery date: 03/20/2017 Surgery type: Sleeve Start weight at [facility]: 402.4 Weight today: 200 Weight loss goal: develop a healthier lifestyle, to do more, decrease blood pressure pills  TANITA  BODY COMP RESULTS  Pt declined   BMI (kg/m^2)    Fat Mass (lbs)    Fat Free Mass (lbs)    Total Body Water (lbs)    The following the learning objectives were met by the patient during this course:  Review of TANITA scale information  Share and discuss bariatric surgery successes and non-scale victories  Identifies Phase VII (Maintenance Phase) Dietary Goals which will be lifelong  Identifies appropriate sources of fluids, proteins, non-starchy vegetables, and complex carbohydrates  Identifies well-balanced meals  Identifies portion control   Identifies appropriate multivitamin and calcium sources post-operatively  Describes the need for physical activity post-operatively and will follow MD recommendations  Identifies and describes SMART goals   Creates at least 2 SMART goals to begin immediately  States when to call healthcare provider regarding medication questions or post-operative complications  Handouts given during class include:  Phase VII: Maintenance Phase-Lifelong  Follow-Up Plan: Patient will follow-up at [facility] for on-going post-op nutrition visits.

## 2018-04-10 DIAGNOSIS — I119 Hypertensive heart disease without heart failure: Secondary | ICD-10-CM | POA: Diagnosis not present

## 2018-04-10 DIAGNOSIS — R0989 Other specified symptoms and signs involving the circulatory and respiratory systems: Secondary | ICD-10-CM | POA: Diagnosis not present

## 2018-04-10 DIAGNOSIS — I10 Essential (primary) hypertension: Secondary | ICD-10-CM | POA: Diagnosis not present

## 2018-04-10 DIAGNOSIS — I2699 Other pulmonary embolism without acute cor pulmonale: Secondary | ICD-10-CM | POA: Diagnosis not present

## 2018-05-30 DIAGNOSIS — I2699 Other pulmonary embolism without acute cor pulmonale: Secondary | ICD-10-CM | POA: Diagnosis not present

## 2018-05-30 DIAGNOSIS — K219 Gastro-esophageal reflux disease without esophagitis: Secondary | ICD-10-CM | POA: Diagnosis not present

## 2018-05-30 DIAGNOSIS — I10 Essential (primary) hypertension: Secondary | ICD-10-CM | POA: Diagnosis not present

## 2018-05-30 DIAGNOSIS — I119 Hypertensive heart disease without heart failure: Secondary | ICD-10-CM | POA: Diagnosis not present

## 2018-05-30 DIAGNOSIS — Z0110 Encounter for examination of ears and hearing without abnormal findings: Secondary | ICD-10-CM | POA: Diagnosis not present

## 2018-05-30 DIAGNOSIS — Z131 Encounter for screening for diabetes mellitus: Secondary | ICD-10-CM | POA: Diagnosis not present

## 2018-05-30 DIAGNOSIS — Z136 Encounter for screening for cardiovascular disorders: Secondary | ICD-10-CM | POA: Diagnosis not present

## 2018-05-30 DIAGNOSIS — Z0000 Encounter for general adult medical examination without abnormal findings: Secondary | ICD-10-CM | POA: Diagnosis not present

## 2018-07-26 DIAGNOSIS — Z9884 Bariatric surgery status: Secondary | ICD-10-CM | POA: Diagnosis not present

## 2018-08-02 DIAGNOSIS — I10 Essential (primary) hypertension: Secondary | ICD-10-CM | POA: Diagnosis not present

## 2018-08-02 DIAGNOSIS — K219 Gastro-esophageal reflux disease without esophagitis: Secondary | ICD-10-CM | POA: Diagnosis not present

## 2018-08-02 DIAGNOSIS — I2699 Other pulmonary embolism without acute cor pulmonale: Secondary | ICD-10-CM | POA: Diagnosis not present

## 2018-08-02 DIAGNOSIS — I119 Hypertensive heart disease without heart failure: Secondary | ICD-10-CM | POA: Diagnosis not present

## 2018-09-13 IMAGING — CT CT ANGIO CHEST
2 of 6 series · 18 of 46 positions shown · IV contrast (ISOVUE)
Comparison: CXR 04/17/2017

CLINICAL DATA: Chest pain and tightness with dyspnea since [REDACTED]
after walk. One month postop for gastric sleeve procedure.

EXAM:
CT ANGIOGRAPHY CHEST WITH CONTRAST
TECHNIQUE: Multidetector CT imaging of the chest was performed using the
standard protocol during bolus administration of intravenous
contrast. Multiplanar CT image reconstructions and MIPs were
obtained to evaluate the vascular anatomy.
CONTRAST:  100mL S432CK-TMC IOPAMIDOL (S432CK-TMC) INJECTION 76%

[Series 5: thins · axial · 0.83mm/px · z∈[-326,-58]mm · 15 of 295 slices shown]
[im 13/295  lung]
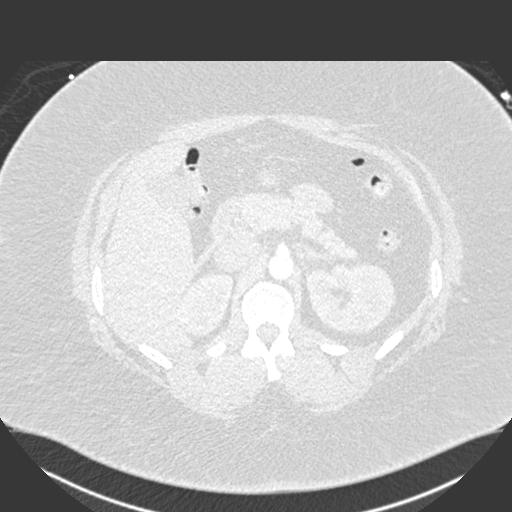
[im 39/295  soft-tissue]
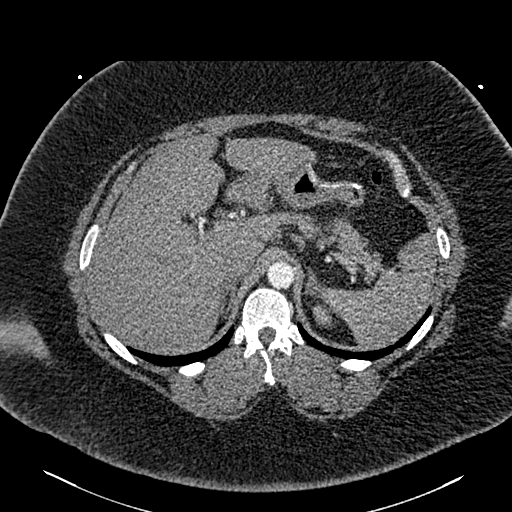
[im 52/295  lung]
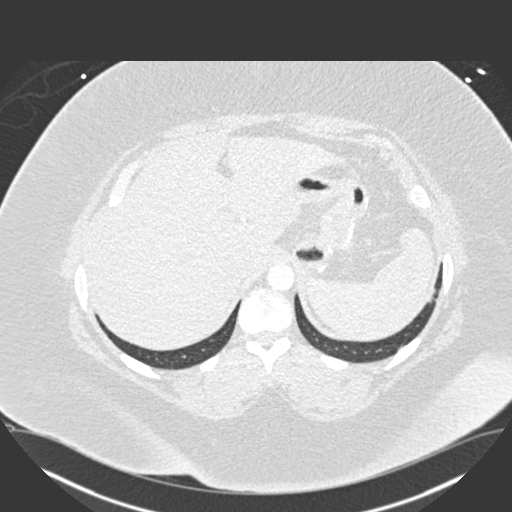
[im 77/295  soft-tissue]
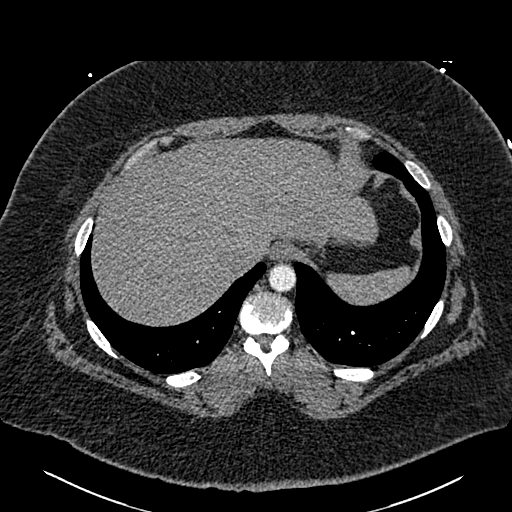
[im 90/295  lung]
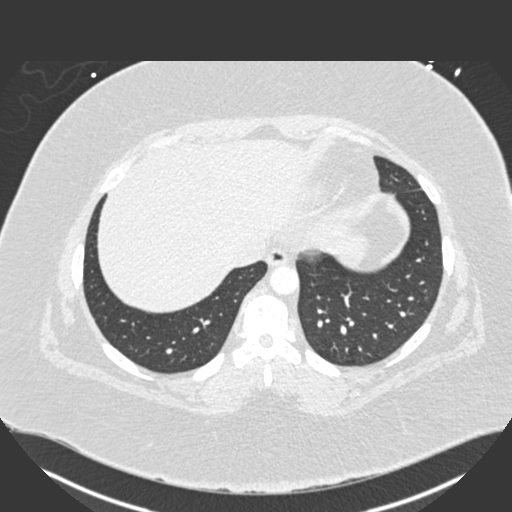
[im 116/295  soft-tissue]
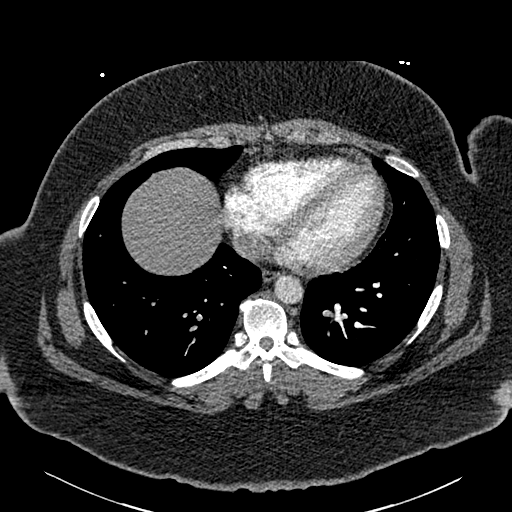
[im 128/295  lung]
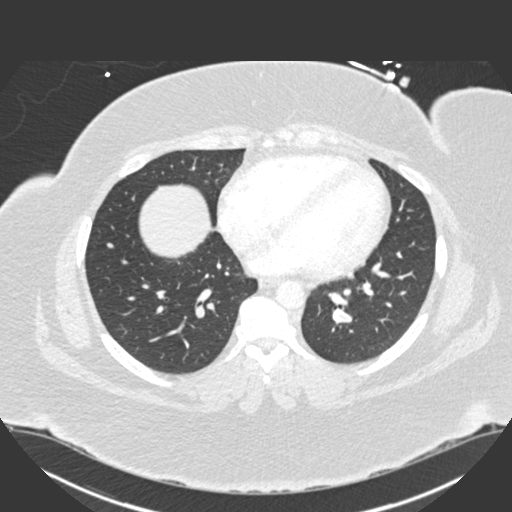
[im 154/295  soft-tissue]
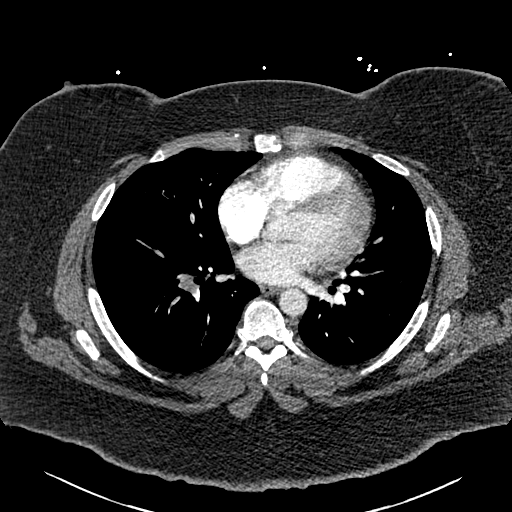
[im 167/295  lung]
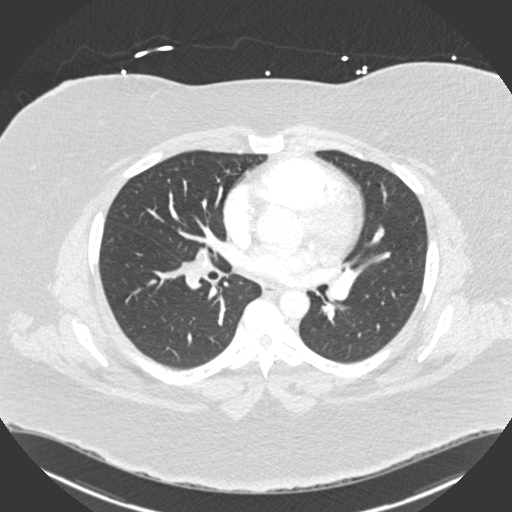
[im 179/295  soft-tissue]
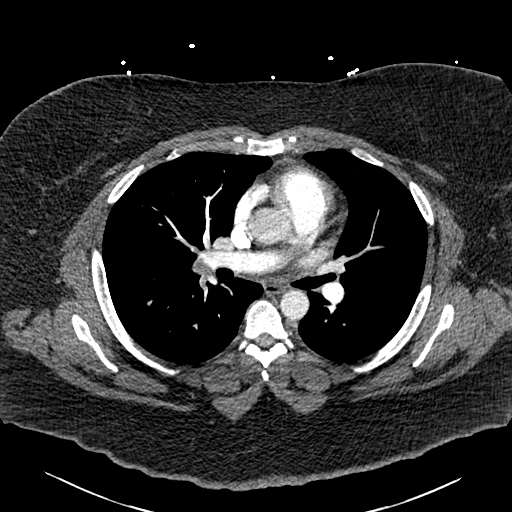
[im 205/295  lung]
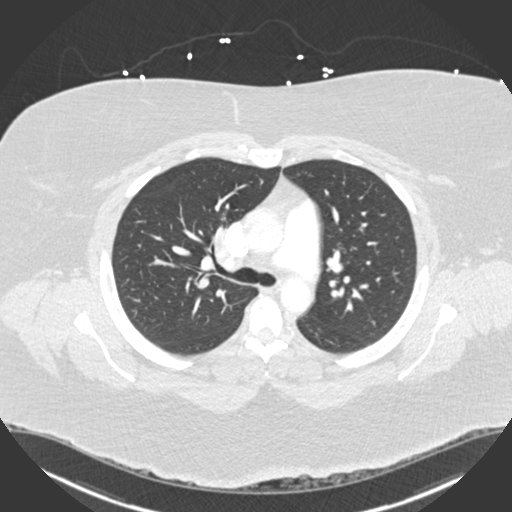
[im 218/295  soft-tissue]
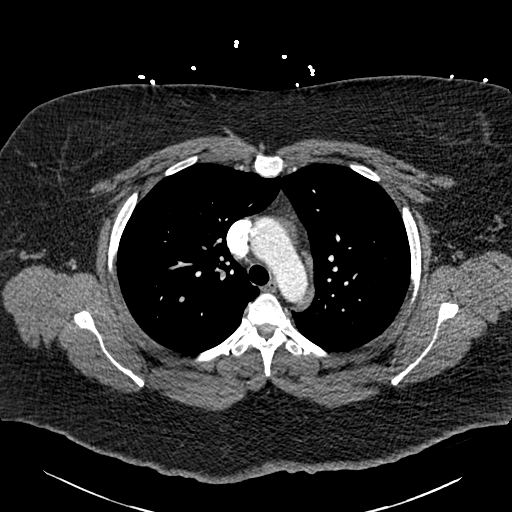
[im 243/295  lung]
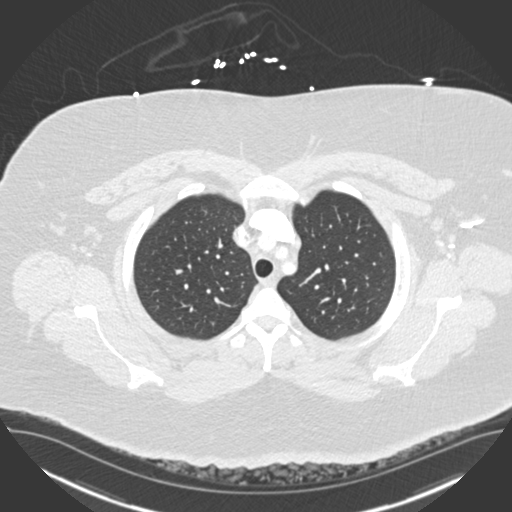
[im 256/295  soft-tissue]
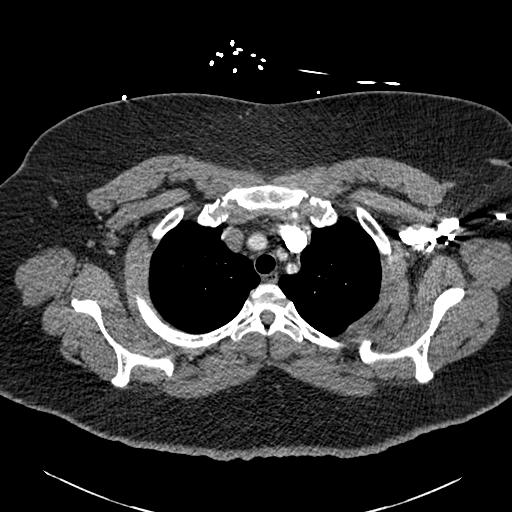
[im 282/295  lung]
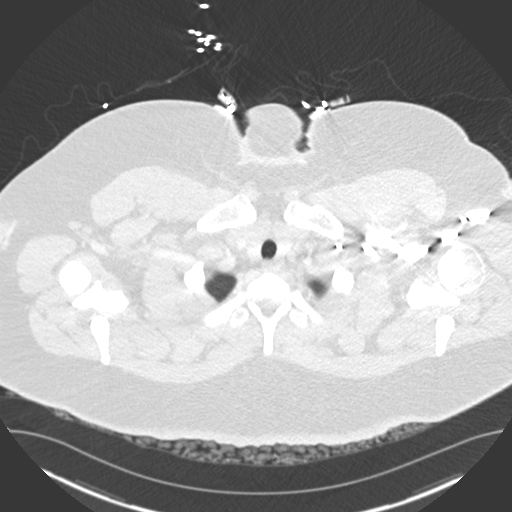

[Series 7: coronal mpr · coronal · 0.60mm/px · 3 of 163 slices shown]
[im 41/163  soft-tissue]
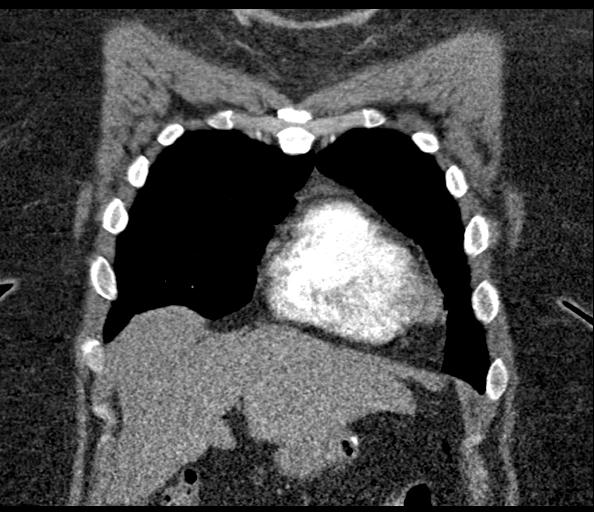
[im 82/163  soft-tissue]
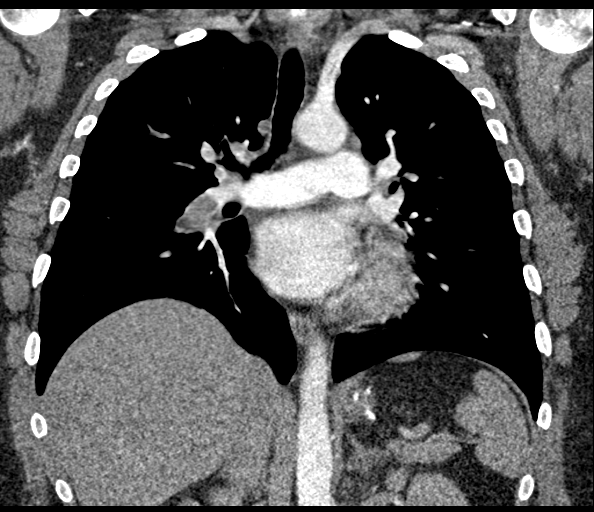
[im 122/163  soft-tissue]
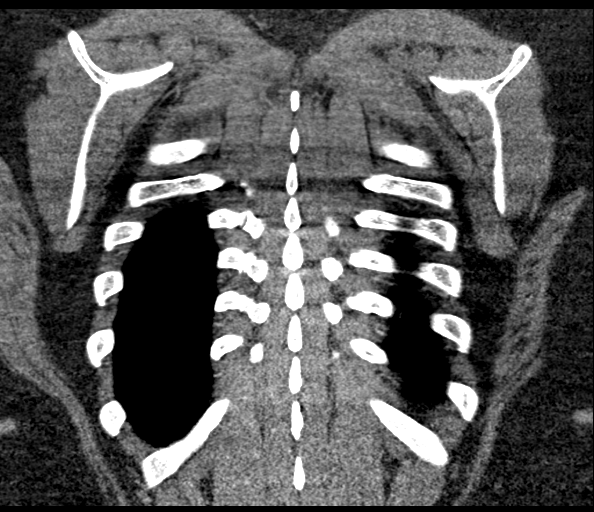

[18 of 46 positions shown; findings below may reference images not displayed]

FINDINGS: Cardiovascular: The study is of quality for the evaluation of
pulmonary embolism. Acute bilateral pulmonary emboli noted at the
branch point of the right main pulmonary artery extending into all
lobes as well as to the left lower lobe and lingula. RV/LV ratio is
0.67. Great vessels are normal in course and caliber. Normal heart
size. No significant pericardial fluid/thickening.

Mediastinum/Nodes: No discrete thyroid nodules. Unremarkable
esophagus. No pathologically enlarged axillary, mediastinal or hilar
lymph nodes.

Lungs/Pleura: No pneumothorax. No pleural effusion. Lungs are clear

Upper abdomen: Staple line across the stomach from gastric sleeve
procedure. No upper abdominal abnormality.

Musculoskeletal:  No aggressive appearing focal osseous lesions.

Review of the MIP images confirms the above findings.
IMPRESSION: Positive for acute bilateral multilobar PE without CT evidence of
right heart strain (RV/LV Ratio = 0.67).

These results were called by telephone at the time of interpretation
on 04/17/2017 at [DATE] to Dr. MODIKWA SONTSHATSHA , who verbally
acknowledged these results.

## 2018-10-24 DIAGNOSIS — Z1231 Encounter for screening mammogram for malignant neoplasm of breast: Secondary | ICD-10-CM | POA: Diagnosis not present

## 2018-10-24 DIAGNOSIS — Z01419 Encounter for gynecological examination (general) (routine) without abnormal findings: Secondary | ICD-10-CM | POA: Diagnosis not present

## 2019-01-08 ENCOUNTER — Other Ambulatory Visit: Payer: Self-pay | Admitting: Family

## 2019-01-08 DIAGNOSIS — N9089 Other specified noninflammatory disorders of vulva and perineum: Secondary | ICD-10-CM | POA: Diagnosis not present

## 2019-01-08 DIAGNOSIS — I82401 Acute embolism and thrombosis of unspecified deep veins of right lower extremity: Secondary | ICD-10-CM

## 2019-01-08 DIAGNOSIS — I2699 Other pulmonary embolism without acute cor pulmonale: Secondary | ICD-10-CM

## 2019-03-04 ENCOUNTER — Encounter: Payer: BC Managed Care – PPO | Attending: Surgery | Admitting: Skilled Nursing Facility1

## 2019-03-04 ENCOUNTER — Other Ambulatory Visit: Payer: Self-pay

## 2019-03-04 DIAGNOSIS — E669 Obesity, unspecified: Secondary | ICD-10-CM | POA: Diagnosis not present

## 2019-03-04 NOTE — Progress Notes (Signed)
Bariatric Nutrition Follow-Up Visit Medical Nutrition Therapy    NUTRITION ASSESSMENT    Anthropometrics  Surgery date: 03/20/2017 Surgery type: Sleeve Start weight at [facility]: 402.4 Weight today: 301.1 Weight loss goal: develop a healthier lifestyle, to do more, decrease blood pressure pills  Body Composition Scale Date  Weight  lbs 301.1  Total Body Fat  % 45.4     Visceral Fat 14  Fat-Free Mass  % 54.5     Total Body Water  % 41.7     Muscle-Mass  lbs 38.2  BMI 41.8  Body Fat Displacement ---        Torso  lbs 84.8        Left Leg  lbs 16.9        Right Leg  lbs 16.9        Left Arm  lbs 8.4        Right Arm  lbs 8.4   Clinical  Medical hx: Medications:  Labs:    Lifestyle & Dietary Hx  Pt states she is still struggling to get in her fluid.  Pt state she wants to buy into [employer].  Pt states protein shakes constipate her, Pt states she is staying aware and realized she is falling back into old habits so she really wants to get back into her healthy habits she once had.   Estimated daily fluid intake: 64+ oz Estimated daily protein intake:60+ g Supplements: none  Current average weekly physical activity: Was BELT  24-Hr Dietary Recall First Meal: coffee with regular creamer then bottle of water and protein shake Snack: hour later jimmy deans delight or oatmeal or cold cereal Second Meal: fast food  Snack:  Third Meal: chicken and mashed potatoes or fried liver with green beans or cabbage  Snack:  Beverages: fruit juice, water, sweet tea  Post-Op Goals/ Signs/ Symptoms Using straws: no Drinking while eating: no Chewing/swallowing difficulties: no Changes in vision: no Changes to mood/headaches: no Hair loss/changes to skin/nails: no Difficulty focusing/concentrating: no Sweating: no Dizziness/lightheadedness: no Palpitations: no  Carbonated/caffeinated beverages: no N/V/D/C/Gas: no; drinking detox tea  Abdominal pain: no Dumping syndrome: no     NUTRITION DIAGNOSIS  Overweight/obesity ([place]-3.3) related to past poor dietary habits and physical inactivity as evidenced by completed bariatric surgery and following dietary guidelines for continued weight loss and healthy nutrition status.     NUTRITION INTERVENTION Nutrition counseling (C-1) and education (E-2) to facilitate bariatric surgery goals, including: . The importance of consuming adequate calories as well as certain nutrients daily due to the body's need for essential vitamins, minerals, and fats . The importance of daily physical activity and to reach a goal of at least 150 minutes of moderate to vigorous physical activity weekly (or as directed by their physician) due to benefits such as increased musculature and improved lab values  The importance of intuitive eating specifically learning hunger-satiety cues and understanding the importance of learning a new body Goals: Get back to taking multivitamin and calcium  Limit fast food to once every other week Cut out sweet tea and juice Aim to have non starchy vegetables 2 times a day 7 days a week Start up [employer] or other exercise program  Continue your daily walks Job job staying aware of your habits and healthy changes Attend support group  Aim for 64 fluid ounces per day Start with 3 ounces of protein, 1/2-1 cup non starchy vegetables, and 1/4-1/2 cup complex carb then really on satiety levels/intutive eating to tell you  how much to eat  Learning Style & Readiness for Change Teaching method utilized: Visual & Auditory  Demonstrated degree of understanding via: Teach Back  Barriers to learning/adherence to lifestyle change: none identified   RD's Notes for Next Visit . Assess adherence to pt chosen goals   MONITORING & EVALUATION Dietary intake, weekly physical activity, body weight  Next Steps Patient is to follow-up in 1 year

## 2019-03-17 DIAGNOSIS — L0291 Cutaneous abscess, unspecified: Secondary | ICD-10-CM | POA: Diagnosis not present

## 2019-03-17 DIAGNOSIS — N9089 Other specified noninflammatory disorders of vulva and perineum: Secondary | ICD-10-CM | POA: Diagnosis not present

## 2019-04-25 DIAGNOSIS — Z09 Encounter for follow-up examination after completed treatment for conditions other than malignant neoplasm: Secondary | ICD-10-CM | POA: Diagnosis not present

## 2019-08-12 DIAGNOSIS — I119 Hypertensive heart disease without heart failure: Secondary | ICD-10-CM | POA: Diagnosis not present

## 2019-08-12 DIAGNOSIS — I2699 Other pulmonary embolism without acute cor pulmonale: Secondary | ICD-10-CM | POA: Diagnosis not present

## 2019-08-12 DIAGNOSIS — K219 Gastro-esophageal reflux disease without esophagitis: Secondary | ICD-10-CM | POA: Diagnosis not present

## 2019-08-12 DIAGNOSIS — I10 Essential (primary) hypertension: Secondary | ICD-10-CM | POA: Diagnosis not present

## 2019-09-02 ENCOUNTER — Encounter: Payer: BC Managed Care – PPO | Attending: Surgery | Admitting: Skilled Nursing Facility1

## 2019-09-02 ENCOUNTER — Other Ambulatory Visit: Payer: Self-pay

## 2019-09-02 DIAGNOSIS — E669 Obesity, unspecified: Secondary | ICD-10-CM | POA: Insufficient documentation

## 2019-09-02 NOTE — Progress Notes (Signed)
Bariatric Nutrition Follow-Up Visit Medical Nutrition Therapy    NUTRITION ASSESSMENT    Anthropometrics  Surgery date: 03/20/2017 Surgery type: Sleeve Start weight at [facility]: 402.4 Weight today: 313 Weight loss goal: develop a healthier lifestyle, to do more, decrease blood pressure pills  Body Composition Scale Date 09/02/2019  Weight  lbs 301.1 313.7  Total Body Fat  % 45.4 46.3     Visceral Fat 14 15  Fat-Free Mass  % 54.5 53.6     Total Body Water  % 41.7 41.3     Muscle-Mass  lbs 38.2 38.3  BMI 41.8 43.6  Body Fat Displacement ---         Torso  lbs 84.8 90.3        Left Leg  lbs 16.9 18        Right Leg  lbs 16.9 18        Left Arm  lbs 8.4 9        Right Arm  lbs 8.4 9   Clinical  Medical hx: Medications:  Labs:    Lifestyle & Dietary Hx  Pt states she does not take her multi or calcium. Pt states she has not been walking and has not been drinking enough water. Pt state she can feel herself eating more sugar and drinking sweet tea. Pt states she wants to wake erlier to be active. Pt state she is working 2 jobs currently.   Estimated daily fluid intake: 64+ oz Estimated daily protein intake:60+ g Supplements: none  Current average weekly physical activity:   24-Hr Dietary Recall First Meal: coffee with regular creamer then bottle of water and protein shake Snack: hour later jimmy deans delight or oatmeal or cold cereal Second Meal: fast food  Snack:  Third Meal: chicken and mashed potatoes or fried liver with green beans or cabbage  Snack:  Beverages: fruit juice, water, sweet tea  Post-Op Goals/ Signs/ Symptoms Using straws: no Drinking while eating: no Chewing/swallowing difficulties: no Changes in vision: no Changes to mood/headaches: no Hair loss/changes to skin/nails: no Difficulty focusing/concentrating: no Sweating: no Dizziness/lightheadedness: no Palpitations: no  Carbonated/caffeinated beverages: no N/V/D/C/Gas: no; drinking detox tea   Abdominal pain: no Dumping syndrome: no    NUTRITION DIAGNOSIS  Overweight/obesity ([place]-3.3) related to past poor dietary habits and physical inactivity as evidenced by completed bariatric surgery and following dietary guidelines for continued weight loss and healthy nutrition status.     NUTRITION INTERVENTION Nutrition counseling (C-1) and education (E-2) to facilitate bariatric surgery goals, including: . The importance of consuming adequate calories as well as certain nutrients daily due to the body's need for essential vitamins, minerals, and fats . The importance of daily physical activity and to reach a goal of at least 150 minutes of moderate to vigorous physical activity weekly (or as directed by their physician) due to benefits such as increased musculature and improved lab values  The importance of intuitive eating specifically learning hunger-satiety cues and understanding the importance of learning a new body Goals: Aim for getting up earlier and working out Work on mindfulness use your should I eat sheet and mindful meals check list  Learning Style & Readiness for Change Teaching method utilized: Visual & Auditory  Demonstrated degree of understanding via: Teach Back  Barriers to learning/adherence to lifestyle change: none identified   RD's Notes for Next Visit . Assess adherence to pt chosen goals   MONITORING & EVALUATION Dietary intake, weekly physical activity, body weight  Next Steps Patient  is to follow-up 1 month

## 2019-09-23 DIAGNOSIS — I2699 Other pulmonary embolism without acute cor pulmonale: Secondary | ICD-10-CM | POA: Diagnosis not present

## 2019-09-23 DIAGNOSIS — K219 Gastro-esophageal reflux disease without esophagitis: Secondary | ICD-10-CM | POA: Diagnosis not present

## 2019-09-23 DIAGNOSIS — I119 Hypertensive heart disease without heart failure: Secondary | ICD-10-CM | POA: Diagnosis not present

## 2019-09-23 DIAGNOSIS — I10 Essential (primary) hypertension: Secondary | ICD-10-CM | POA: Diagnosis not present

## 2019-10-02 ENCOUNTER — Encounter: Payer: BC Managed Care – PPO | Attending: Surgery | Admitting: Skilled Nursing Facility1

## 2019-10-02 ENCOUNTER — Other Ambulatory Visit: Payer: Self-pay

## 2019-10-02 DIAGNOSIS — E669 Obesity, unspecified: Secondary | ICD-10-CM | POA: Diagnosis not present

## 2019-10-02 NOTE — Progress Notes (Signed)
Bariatric Nutrition Follow-Up Visit Medical Nutrition Therapy    NUTRITION ASSESSMENT    Anthropometrics  Surgery date: 03/20/2017 Surgery type: Sleeve Start weight at [facility]: 402.4 Weight today: 313 Weight loss goal: develop a healthier lifestyle, to do more, decrease blood pressure pills  Body Composition Scale Date 09/02/2019 10/02/2019  Weight  lbs 301.1 313.7 315.7  Total Body Fat  % 45.4 46.3 46.5     Visceral Fat 14 15 15   Fat-Free Mass  % 54.5 53.6 53.4     Total Body Water  % 41.7 41.3 41.2     Muscle-Mass  lbs 38.2 38.3 38.3  BMI 41.8 43.6 43.9  Body Fat Displacement ---          Torso  lbs 84.8 90.3 91.2        Left Leg  lbs 16.9 18 18.2        Right Leg  lbs 16.9 18 18.2        Left Arm  lbs 8.4 9 9.1        Right Arm  lbs 8.4 9 9.1   Clinical  Medical hx: Medications:  Labs:    Lifestyle & Dietary Hx  Pt states she really feels she has been on it.  Pt state she has been taking her vitamins regularly which she feels helps her not to be so tired. Pt states she has been listening to her body more helping her to make changes seeing where she feels better when she does right by her body. Pt states she started walking in the morning since having to drop her son at school. Pt states she is not ready to start on any other changes at this time stating she wants to contue with the things she is currently working on until she is fully accomplished with them.   Estimated daily fluid intake: 64+ oz Estimated daily protein intake:60+ g Supplements: ,ulti and calcium  Current average weekly physical activity: walking 1.5-3 miles 5 days a week sometimes on break to, squats and other movements   24-Hr Dietary Recall First Meal: protein shake and yogurt or boiled egg + fruit Snack: sausage patty and yogurt Snack: cheese puffs or nuts Second Meal: tuna on wheat bread or chicken salad  Snack:  Third Meal: steak + mixed greens + cauliflower  Snack 11:30: nuts Beverages:  fruit juice, water, sweet tea  Post-Op Goals/ Signs/ Symptoms Using straws: no Drinking while eating: no Chewing/swallowing difficulties: no Changes in vision: no Changes to mood/headaches: no Hair loss/changes to skin/nails: no Difficulty focusing/concentrating: no Sweating: no Dizziness/lightheadedness: no Palpitations: no  Carbonated/caffeinated beverages: no N/V/D/C/Gas: no; drinking detox tea  Abdominal pain: no Dumping syndrome: no    NUTRITION DIAGNOSIS  Overweight/obesity ([place]-3.3) related to past poor dietary habits and physical inactivity as evidenced by completed bariatric surgery and following dietary guidelines for continued weight loss and healthy nutrition status.     NUTRITION INTERVENTION Nutrition counseling (C-1) and education (E-2) to facilitate bariatric surgery goals, including:  The importance of consuming adequate calories as well as certain nutrients daily due to the body's need for essential vitamins, minerals, and fats  The importance of daily physical activity and to reach a goal of at least 150 minutes of moderate to vigorous physical activity weekly (or as directed by their physician) due to benefits such as increased musculature and improved lab values  The importance of intuitive eating specifically learning hunger-satiety cues and understanding the importance of learning a new body Goals: Start  attending support group again  Learning Style & Readiness for Change Teaching method utilized: Visual & Auditory  Demonstrated degree of understanding via: Teach Back  Barriers to learning/adherence to lifestyle change: none identified   RD's Notes for Next Visit  Assess adherence to pt chosen goals   MONITORING & EVALUATION Dietary intake, weekly physical activity, body weight  Next Steps Patient is to follow-up as needed

## 2019-10-03 ENCOUNTER — Other Ambulatory Visit: Payer: Self-pay | Admitting: Family

## 2019-10-03 DIAGNOSIS — I2699 Other pulmonary embolism without acute cor pulmonale: Secondary | ICD-10-CM

## 2019-10-07 ENCOUNTER — Inpatient Hospital Stay (HOSPITAL_BASED_OUTPATIENT_CLINIC_OR_DEPARTMENT_OTHER): Payer: BC Managed Care – PPO | Admitting: Family

## 2019-10-07 ENCOUNTER — Encounter: Payer: Self-pay | Admitting: Family

## 2019-10-07 ENCOUNTER — Inpatient Hospital Stay: Payer: BC Managed Care – PPO | Attending: Hematology & Oncology

## 2019-10-07 ENCOUNTER — Other Ambulatory Visit: Payer: Self-pay

## 2019-10-07 ENCOUNTER — Telehealth: Payer: Self-pay | Admitting: Family

## 2019-10-07 VITALS — BP 133/91 | HR 65 | Temp 98.7°F | Resp 18 | Ht 71.0 in | Wt 310.0 lb

## 2019-10-07 DIAGNOSIS — Z86718 Personal history of other venous thrombosis and embolism: Secondary | ICD-10-CM | POA: Insufficient documentation

## 2019-10-07 DIAGNOSIS — Z86711 Personal history of pulmonary embolism: Secondary | ICD-10-CM | POA: Diagnosis not present

## 2019-10-07 DIAGNOSIS — I2699 Other pulmonary embolism without acute cor pulmonale: Secondary | ICD-10-CM

## 2019-10-07 LAB — CMP (CANCER CENTER ONLY)
ALT: 10 U/L (ref 0–44)
AST: 12 U/L — ABNORMAL LOW (ref 15–41)
Albumin: 4 g/dL (ref 3.5–5.0)
Alkaline Phosphatase: 61 U/L (ref 38–126)
Anion gap: 7 (ref 5–15)
BUN: 11 mg/dL (ref 6–20)
CO2: 31 mmol/L (ref 22–32)
Calcium: 9.5 mg/dL (ref 8.9–10.3)
Chloride: 99 mmol/L (ref 98–111)
Creatinine: 0.9 mg/dL (ref 0.44–1.00)
GFR, Est AFR Am: >60 mL/min (ref >60)
GFR, Estimated: >60 mL/min (ref >60)
Glucose, Bld: 91 mg/dL (ref 70–99)
Potassium: 4.1 mmol/L (ref 3.5–5.1)
Sodium: 137 mmol/L (ref 135–145)
Total Bilirubin: 0.8 mg/dL (ref 0.3–1.2)
Total Protein: 7.5 g/dL (ref 6.5–8.1)

## 2019-10-07 LAB — CBC WITH DIFFERENTIAL (CANCER CENTER ONLY)
Abs Immature Granulocytes: 0.03 10*3/uL (ref 0.00–0.07)
Basophils Absolute: 0 10*3/uL (ref 0.0–0.1)
Basophils Relative: 1 %
Eosinophils Absolute: 0.1 10*3/uL (ref 0.0–0.5)
Eosinophils Relative: 1 %
HCT: 41.3 % (ref 36.0–46.0)
Hemoglobin: 13.5 g/dL (ref 12.0–15.0)
Immature Granulocytes: 0 %
Lymphocytes Relative: 31 %
Lymphs Abs: 2.4 10*3/uL (ref 0.7–4.0)
MCH: 29.5 pg (ref 26.0–34.0)
MCHC: 32.7 g/dL (ref 30.0–36.0)
MCV: 90.4 fL (ref 80.0–100.0)
Monocytes Absolute: 0.6 10*3/uL (ref 0.1–1.0)
Monocytes Relative: 8 %
Neutro Abs: 4.6 10*3/uL (ref 1.7–7.7)
Neutrophils Relative %: 59 %
Platelet Count: 317 10*3/uL (ref 150–400)
RBC: 4.57 MIL/uL (ref 3.87–5.11)
RDW: 12.1 % (ref 11.5–15.5)
WBC Count: 7.8 10*3/uL (ref 4.0–10.5)
nRBC: 0 % (ref 0.0–0.2)

## 2019-10-07 LAB — D-DIMER, QUANTITATIVE: D-Dimer, Quant: 0.34 ug/mL-FEU (ref 0.00–0.50)

## 2019-10-07 NOTE — Telephone Encounter (Signed)
Appointments scheduled calendar printed & mailed per 9/7 los 

## 2019-10-07 NOTE — Progress Notes (Signed)
Hematology and Oncology Follow Up Visit  Ashley Miller [id number] 1980/02/20 38 y.o. 10/07/2019   Principle Diagnosis:  Bilateral PE and right lower extremity DVT  Current Therapy:   Observation   Interim History:  Ashley Miller is here today to re-establish care. We last saw her in July 2019.  She is doing well but has noted cramping in both calves that comes and goes. She states that this may be due to her HCTZ but wants to make sure.  She stopped taking her Xarelto in January 2020 and is not on aspirin.  Her hyper coag work up with initial visit was negative.  Her repeat CT angio in August 2019 showed resolution of PE and repeat US of both legs at that time showed no evidence of DVT. She had just had gastric sleeve surgery and was also on Megace prior to having her IUD placed. These may have certainly contributed to her developing the blood clots.  She has not noted any blood loss. No bruising or petechiae.  No fever, chills, n/v, cough, rash, dizziness, SOB, chest pain, palpitations, abdominal pain or changes in bowel or bladder habits.  No swelling or tenderness in her extremities at this time.  She has numbness and tingling that comes and goes in her fingertips and toes. Pedal pulses are 3+. No falls or syncope.  She has maintained a good appetite and is doing her best to stay properly hydrated. Her weight is stable.   ECOG Performance Status: 1 - Symptomatic but completely ambulatory  Medications:  Allergies as of 10/07/2019      Reactions   Penicillins Nausea And Vomiting, Other (See Comments)   Yeast infection Has patient had a PCN reaction causing immediate rash, facial/tongue/throat swelling, SOB or lightheadedness with hypotension: Yes Has patient had a PCN reaction causing severe rash involving mucus membranes or skin necrosis: No Has patient had a PCN reaction that required hospitalization: No Has patient had a PCN reaction occurring within the last 10 years: No If all of  the above answers are "NO", then may proceed with Cephalosporin use.      Medication List       Accurate as of October 07, 2019 12:06 PM. If you have any questions, ask your nurse or doctor.        calcium carbonate 500 MG chewable tablet Commonly known as: TUMS - dosed in mg elemental calcium Chew 1 tablet by mouth daily as needed for indigestion or heartburn.   CENTRUM MULTIGUMMIES PO Take 1 each by mouth daily.   hydrochlorothiazide 12.5 MG tablet Commonly known as: HYDRODIURIL Take 12.5 mg by mouth daily.   Xarelto 20 MG Tabs tablet Generic drug: rivaroxaban TAKE 1 TABLET (20 MG TOTAL) BY MOUTH DAILY WITH SUPPER.       Allergies:  Allergies  Allergen Reactions  . Penicillins Nausea And Vomiting and Other (See Comments)    Yeast infection Has patient had a PCN reaction causing immediate rash, facial/tongue/throat swelling, SOB or lightheadedness with hypotension: Yes Has patient had a PCN reaction causing severe rash involving mucus membranes or skin necrosis: No Has patient had a PCN reaction that required hospitalization: No Has patient had a PCN reaction occurring within the last 10 years: No If all of the above answers are "NO", then may proceed with Cephalosporin use.     Past Medical History, Surgical history, Social history, and Family History were reviewed and updated.  Review of Systems: All other 10 point review of systems is  negative.   Physical Exam:  vitals were not taken for this visit.   Wt Readings from Last 3 Encounters:  10/02/19 (!) 315 lb 11.2 oz (143.2 kg)  09/02/19 (!) 313 lb 11.2 oz (142.3 kg)  03/04/19 (!) 301 lb 1.6 oz (136.6 kg)    Ocular: Sclerae unicteric, pupils equal, round and reactive to light Ear-nose-throat: Oropharynx clear, dentition fair Lymphatic: No cervical or supraclavicular adenopathy Lungs no rales or rhonchi, good excursion bilaterally Heart regular rate and rhythm, no murmur appreciated Abd soft, nontender,  positive bowel sounds MSK no focal spinal tenderness, no joint edema Neuro: non-focal, well-oriented, appropriate affect Breasts: Deferred   Lab Results  Component Value Date   WBC 7.8 10/07/2019   HGB 13.5 10/07/2019   HCT 41.3 10/07/2019   MCV 90.4 10/07/2019   PLT 317 10/07/2019   Lab Results  Component Value Date   FERRITIN 26 08/24/2017   IRON 53 08/24/2017   TIBC 365 08/24/2017   UIBC 312 08/24/2017   IRONPCTSAT 15 (L) 08/24/2017   Lab Results  Component Value Date   RBC 4.57 10/07/2019   No results found for: KPAFRELGTCHN, LAMBDASER, KAPLAMBRATIO No results found for: IGGSERUM, IGA, IGMSERUM No results found for: Dorene Ar, A1GS, A2GS, Karn Pickler, SPEI   Chemistry      Component Value Date/Time   NA 137 08/24/2017 0840   K 3.7 08/24/2017 0840   CL 103 08/24/2017 0840   CO2 25 08/24/2017 0840   BUN 13 08/24/2017 0840   CREATININE 0.94 08/24/2017 0840      Component Value Date/Time   CALCIUM 9.5 08/24/2017 0840   ALKPHOS 62 08/24/2017 0840   AST 17 08/24/2017 0840   ALT 18 08/24/2017 0840   BILITOT 0.6 08/24/2017 0840       Impression and Plan: Ashley Miller is a very pleasant 39 yo [place] American female bilateral pulmonary emboli without evidence of heart strain and right lower extremity DVT.  She is doing well but has noted cramping in both calves. We will get an [place] to rule out DVT. If negative, no need to restart Xarelto per Dr. Myna Hidalgo.   We will go ahead and plan to see her back in another year.  She can contact our office with any questions or concerns. We can certainly see her sooner if needed.   Emeline Gins, NP 9/7/202112:06 PM

## 2019-10-14 ENCOUNTER — Other Ambulatory Visit: Payer: Self-pay

## 2019-10-14 ENCOUNTER — Ambulatory Visit (HOSPITAL_BASED_OUTPATIENT_CLINIC_OR_DEPARTMENT_OTHER)
Admission: RE | Admit: 2019-10-14 | Discharge: 2019-10-14 | Disposition: A | Discharge status: Home / Self Care | Payer: BC Managed Care – PPO | Source: Ambulatory Visit | Attending: Family | Admitting: Family

## 2019-10-14 DIAGNOSIS — M79662 Pain in left lower leg: Secondary | ICD-10-CM | POA: Diagnosis not present

## 2019-10-14 DIAGNOSIS — Z86718 Personal history of other venous thrombosis and embolism: Secondary | ICD-10-CM | POA: Insufficient documentation

## 2019-10-14 DIAGNOSIS — M79604 Pain in right leg: Secondary | ICD-10-CM | POA: Diagnosis not present

## 2019-10-14 DIAGNOSIS — M79605 Pain in left leg: Secondary | ICD-10-CM | POA: Diagnosis not present

## 2019-10-14 DIAGNOSIS — M79661 Pain in right lower leg: Secondary | ICD-10-CM | POA: Diagnosis not present

## 2019-10-15 ENCOUNTER — Telehealth: Payer: Self-pay | Admitting: *Deleted

## 2019-10-15 NOTE — Telephone Encounter (Signed)
Pt notified per order of S. [place] NP that there is "no DVT".  Pt appreciative of call and has no questions or concerns at this time.

## 2019-10-15 NOTE — Telephone Encounter (Signed)
-----   Message from Verdie Mosher, NP sent at 10/14/2019  4:31 PM EDT ----- No DVT!!!!!!! WOOO HOOOO!!!!!!  ----- Message ----- From: Interface, Rad Results In Sent: 10/14/2019   1:42 PM EDT To: Verdie Mosher, NP

## 2019-10-20 DIAGNOSIS — N760 Acute vaginitis: Secondary | ICD-10-CM | POA: Diagnosis not present

## 2019-10-20 DIAGNOSIS — B9689 Other specified bacterial agents as the cause of diseases classified elsewhere: Secondary | ICD-10-CM | POA: Diagnosis not present

## 2019-10-20 DIAGNOSIS — B009 Herpesviral infection, unspecified: Secondary | ICD-10-CM | POA: Diagnosis not present

## 2019-10-20 DIAGNOSIS — N898 Other specified noninflammatory disorders of vagina: Secondary | ICD-10-CM | POA: Diagnosis not present

## 2020-01-06 DIAGNOSIS — M1711 Unilateral primary osteoarthritis, right knee: Secondary | ICD-10-CM | POA: Diagnosis not present

## 2020-01-06 DIAGNOSIS — Z9884 Bariatric surgery status: Secondary | ICD-10-CM | POA: Diagnosis not present

## 2020-01-06 DIAGNOSIS — I10 Essential (primary) hypertension: Secondary | ICD-10-CM | POA: Diagnosis not present

## 2020-01-06 DIAGNOSIS — S83241D Other tear of medial meniscus, current injury, right knee, subsequent encounter: Secondary | ICD-10-CM | POA: Diagnosis not present

## 2020-01-13 DIAGNOSIS — K219 Gastro-esophageal reflux disease without esophagitis: Secondary | ICD-10-CM | POA: Diagnosis not present

## 2020-01-13 DIAGNOSIS — I10 Essential (primary) hypertension: Secondary | ICD-10-CM | POA: Diagnosis not present

## 2020-01-13 DIAGNOSIS — I119 Hypertensive heart disease without heart failure: Secondary | ICD-10-CM | POA: Diagnosis not present

## 2020-01-13 DIAGNOSIS — I2699 Other pulmonary embolism without acute cor pulmonale: Secondary | ICD-10-CM | POA: Diagnosis not present

## 2020-02-02 DIAGNOSIS — R059 Cough, unspecified: Secondary | ICD-10-CM | POA: Diagnosis not present

## 2020-02-02 DIAGNOSIS — I2699 Other pulmonary embolism without acute cor pulmonale: Secondary | ICD-10-CM | POA: Diagnosis not present

## 2020-02-02 DIAGNOSIS — I119 Hypertensive heart disease without heart failure: Secondary | ICD-10-CM | POA: Diagnosis not present

## 2020-02-02 DIAGNOSIS — I10 Essential (primary) hypertension: Secondary | ICD-10-CM | POA: Diagnosis not present

## 2020-02-10 DIAGNOSIS — U071 COVID-19: Secondary | ICD-10-CM | POA: Diagnosis not present

## 2020-02-17 DIAGNOSIS — Z20822 Contact with and (suspected) exposure to covid-19: Secondary | ICD-10-CM | POA: Diagnosis not present

## 2020-02-26 DIAGNOSIS — Z1231 Encounter for screening mammogram for malignant neoplasm of breast: Secondary | ICD-10-CM | POA: Diagnosis not present

## 2020-03-04 DIAGNOSIS — N898 Other specified noninflammatory disorders of vagina: Secondary | ICD-10-CM | POA: Diagnosis not present

## 2020-03-04 DIAGNOSIS — Z01411 Encounter for gynecological examination (general) (routine) with abnormal findings: Secondary | ICD-10-CM | POA: Diagnosis not present

## 2020-03-04 DIAGNOSIS — Z1151 Encounter for screening for human papillomavirus (HPV): Secondary | ICD-10-CM | POA: Diagnosis not present

## 2020-03-04 DIAGNOSIS — Z01419 Encounter for gynecological examination (general) (routine) without abnormal findings: Secondary | ICD-10-CM | POA: Diagnosis not present

## 2020-03-04 DIAGNOSIS — Z79899 Other long term (current) drug therapy: Secondary | ICD-10-CM | POA: Diagnosis not present

## 2020-03-04 DIAGNOSIS — Z975 Presence of (intrauterine) contraceptive device: Secondary | ICD-10-CM | POA: Diagnosis not present

## 2020-03-08 DIAGNOSIS — Z136 Encounter for screening for cardiovascular disorders: Secondary | ICD-10-CM | POA: Diagnosis not present

## 2020-03-08 DIAGNOSIS — I119 Hypertensive heart disease without heart failure: Secondary | ICD-10-CM | POA: Diagnosis not present

## 2020-03-08 DIAGNOSIS — I10 Essential (primary) hypertension: Secondary | ICD-10-CM | POA: Diagnosis not present

## 2020-03-08 DIAGNOSIS — K219 Gastro-esophageal reflux disease without esophagitis: Secondary | ICD-10-CM | POA: Diagnosis not present

## 2020-03-08 DIAGNOSIS — Z131 Encounter for screening for diabetes mellitus: Secondary | ICD-10-CM | POA: Diagnosis not present

## 2020-03-08 DIAGNOSIS — Z1329 Encounter for screening for other suspected endocrine disorder: Secondary | ICD-10-CM | POA: Diagnosis not present

## 2020-03-08 DIAGNOSIS — E559 Vitamin D deficiency, unspecified: Secondary | ICD-10-CM | POA: Diagnosis not present

## 2020-03-08 DIAGNOSIS — Z0001 Encounter for general adult medical examination with abnormal findings: Secondary | ICD-10-CM | POA: Diagnosis not present

## 2020-04-05 DIAGNOSIS — R635 Abnormal weight gain: Secondary | ICD-10-CM | POA: Diagnosis not present

## 2020-04-05 DIAGNOSIS — R7303 Prediabetes: Secondary | ICD-10-CM | POA: Diagnosis not present

## 2020-04-05 DIAGNOSIS — R0602 Shortness of breath: Secondary | ICD-10-CM | POA: Diagnosis not present

## 2020-04-05 DIAGNOSIS — J309 Allergic rhinitis, unspecified: Secondary | ICD-10-CM | POA: Diagnosis not present

## 2020-05-12 DIAGNOSIS — R7303 Prediabetes: Secondary | ICD-10-CM | POA: Diagnosis not present

## 2020-05-12 DIAGNOSIS — R635 Abnormal weight gain: Secondary | ICD-10-CM | POA: Diagnosis not present

## 2020-05-12 DIAGNOSIS — Z903 Acquired absence of stomach [part of]: Secondary | ICD-10-CM | POA: Diagnosis not present

## 2020-06-16 DIAGNOSIS — Z9884 Bariatric surgery status: Secondary | ICD-10-CM | POA: Diagnosis not present

## 2020-07-20 DIAGNOSIS — M545 Low back pain, unspecified: Secondary | ICD-10-CM | POA: Diagnosis not present

## 2020-07-20 DIAGNOSIS — J0110 Acute frontal sinusitis, unspecified: Secondary | ICD-10-CM | POA: Diagnosis not present

## 2020-07-20 DIAGNOSIS — I119 Hypertensive heart disease without heart failure: Secondary | ICD-10-CM | POA: Diagnosis not present

## 2020-07-20 DIAGNOSIS — I10 Essential (primary) hypertension: Secondary | ICD-10-CM | POA: Diagnosis not present

## 2020-07-21 ENCOUNTER — Encounter (HOSPITAL_COMMUNITY): Payer: Self-pay | Admitting: *Deleted

## 2020-07-21 ENCOUNTER — Emergency Department (HOSPITAL_COMMUNITY): Payer: BC Managed Care – PPO

## 2020-07-21 ENCOUNTER — Emergency Department (HOSPITAL_COMMUNITY)
Admission: EM | Admit: 2020-07-21 | Discharge: 2020-07-21 | Disposition: A | Discharge status: Home / Self Care | Payer: BC Managed Care – PPO | Attending: Emergency Medicine | Admitting: Emergency Medicine

## 2020-07-21 ENCOUNTER — Other Ambulatory Visit: Payer: Self-pay

## 2020-07-21 DIAGNOSIS — I10 Essential (primary) hypertension: Secondary | ICD-10-CM | POA: Diagnosis not present

## 2020-07-21 DIAGNOSIS — Z79899 Other long term (current) drug therapy: Secondary | ICD-10-CM | POA: Diagnosis not present

## 2020-07-21 DIAGNOSIS — M545 Low back pain, unspecified: Secondary | ICD-10-CM | POA: Diagnosis not present

## 2020-07-21 DIAGNOSIS — M549 Dorsalgia, unspecified: Secondary | ICD-10-CM | POA: Diagnosis not present

## 2020-07-21 DIAGNOSIS — M5442 Lumbago with sciatica, left side: Secondary | ICD-10-CM | POA: Diagnosis not present

## 2020-07-21 LAB — I-STAT BETA HCG BLOOD, ED (MC, WL, AP ONLY): I-stat hCG, quantitative: <5 m[IU]/mL (ref <5)

## 2020-07-21 MED ORDER — METHOCARBAMOL 500 MG PO TABS
500.0000 mg | ORAL_TABLET | Freq: Once | ORAL | Status: AC
  Administered 2020-07-21: 500 mg via ORAL
  Filled 2020-07-21: qty 1

## 2020-07-21 MED ORDER — METHOCARBAMOL 500 MG PO TABS: 500.0000 mg | ORAL_TABLET | Freq: Two times a day (BID) | ORAL | 0 refills | Status: DC

## 2020-07-21 MED ORDER — KETOROLAC TROMETHAMINE 60 MG/2ML IM SOLN
60.0000 mg | Freq: Once | INTRAMUSCULAR | Status: AC
  Administered 2020-07-21: 60 mg via INTRAMUSCULAR
  Filled 2020-07-21: qty 2

## 2020-07-21 MED ORDER — LIDOCAINE 5 % EX PTCH: 1.0000 | MEDICATED_PATCH | CUTANEOUS | 0 refills | Status: DC

## 2020-07-21 NOTE — ED Triage Notes (Signed)
Pt complains of lower back pain x 3 day. Went to PCP, received some medication and had some relief. She bent over to pick something up today and couldn't get back up. Has pain in low back down left side.

## 2020-07-21 NOTE — ED Provider Notes (Signed)
[facility] EMERGENCY DEPARTMENT Provider Note  CSN: [id number] Arrival date & time: 07/21/20 1815    History Chief Complaint  Patient presents with   Back Pain     Back Pain  Ashley Miller is a 40 y.o. female with history of HTN reports three days of L lower back pain. Saw her PCP for same and given Rx for anti-inflammatory (maybe diclofenac) which helped initially but earlier today while bending over she had sudden worsening in pain, back spasm radiating down her L leg. No numbness/weakness or lightning sensation. She has not had any incontinence. She was initially taken by EMS to [facility] but left due to long wait times and brought by husband for evaluation here.    Past Medical History:  Diagnosis Date   Hypertension     Past Surgical History:  Procedure Laterality Date   DILATION AND CURETTAGE OF UTERUS     LAPAROSCOPIC GASTRIC SLEEVE RESECTION N/A 03/20/2017   Procedure: LAPAROSCOPIC GASTRIC SLEEVE RESECTION WITH UPPER ENDO;  Surgeon: Luretha Murphy, MD;  Location: [facility];  Service: General;  Laterality: N/A;    Family History  Problem Relation Age of Onset   Diabetes Other    Hypertension Other    Heart disease Other     Social History   Tobacco Use   Smoking status: Never   Smokeless tobacco: Never  Vaping Use   Vaping Use: Never used  Substance Use Topics   Alcohol use: No   Drug use: No     Home Medications Prior to Admission medications   Medication Sig Start Date End Date Taking? Authorizing Provider  lidocaine (LIDODERM) 5 % Place 1 patch onto the skin daily. Remove & Discard patch within 12 hours or as directed by MD 07/21/20  Yes Pollyann Savoy, MD  methocarbamol (ROBAXIN) 500 MG tablet Take 1 tablet (500 mg total) by mouth 2 (two) times daily. 07/21/20  Yes Pollyann Savoy, MD  calcium carbonate (TUMS - DOSED IN MG ELEMENTAL CALCIUM) 500 MG chewable tablet Chew 1 tablet by mouth daily as needed for indigestion or heartburn.    [provider]  hydrochlorothiazide (HYDRODIURIL) 12.5 MG tablet Take 12.5 mg by mouth daily. 04/23/17   [provider]  Multiple Vitamins-Minerals (CENTRUM MULTIGUMMIES PO) Take 1 each by mouth daily.    [provider]     Allergies    Penicillins   Review of Systems   Review of Systems  Musculoskeletal:  Positive for back pain.  A comprehensive review of systems was completed and negative except as noted in HPI.    Physical Exam BP 122/83   Pulse 77   Temp 97.9 F (36.6 C) (Oral)   Resp (!) 22   LMP 06/30/2020 (Approximate)   SpO2 97%   Physical Exam Vitals and nursing note reviewed.  HENT:     Head: Normocephalic.     Nose: Nose normal.  Eyes:     Extraocular Movements: Extraocular movements intact.  Pulmonary:     Effort: Pulmonary effort is normal.  Musculoskeletal:        General: Tenderness (L lumbar paraspinal muscles) present. Normal range of motion.     Cervical back: Neck supple.  Skin:    Findings: No rash (on exposed skin).  Neurological:     Mental Status: She is alert and oriented to person, place, and time.     Sensory: No sensory deficit.     Motor: No weakness.  Gait: Gait abnormal (antalgic gait).  Psychiatric:        Mood and Affect: Mood normal.     ED Results / Procedures / Treatments   Labs (all labs ordered are listed, but only abnormal results are displayed) Labs Reviewed  I-STAT BETA HCG BLOOD, ED ([facility], [facility], [facility] ONLY)    EKG None  Radiology DG Lumbar Spine Complete  Result Date: 07/21/2020 CLINICAL DATA:  Back pain EXAM: LUMBAR SPINE - COMPLETE 4+ VIEW COMPARISON:  None. FINDINGS: 5 nonrib bearing lumbar-type vertebral bodies. Vertebral body heights are maintained. No acute fracture. No static listhesis. No spondylolysis. Mild degenerative disease with disc height loss at T12-L1 and L1-2. SI joints are unremarkable. T-type IUD noted in the pelvis. IMPRESSION: No acute osseous injury of the lumbar spine.  Electronically Signed   By: Elige Ko   On: 07/21/2020 19:27    Procedures Procedures  Medications Ordered in the ED Medications  ketorolac (TORADOL) injection 60 mg (60 mg Intramuscular Given 07/21/20 2140)  methocarbamol (ROBAXIN) tablet 500 mg (500 mg Oral Given 07/21/20 2140)     MDM Rules/Calculators/A&P MDM  Here for MSK back pain. Maybe some mild radicular symptoms as well. No red flags. Xray ordered in triage is neg. Will give IM toradol and PO robaxin for pain and reassess for dispo.   ED Course  I have reviewed the triage vital signs and the nursing notes.  Pertinent labs & imaging results that were available during my care of the patient were reviewed by me and considered in my medical decision making (see chart for details).  Clinical Course as of 07/21/20 2213  Wed Jul 21, 2020  2211 Patient reports pain is improved. Will d/c with continued NSAID at home, add Robaxin, Lidoderm and PCP follow up. Recommend avoid heavy lifting or strenuous activity. Avoid driving or operating machinery when taking robaxin. Do not mix with alcohol.  [CS]    Clinical Course User Index [CS] Pollyann Savoy, MD    Final Clinical Impression(s) / ED Diagnoses Final diagnoses:  Acute left-sided low back pain with left-sided sciatica    Rx / DC Orders ED Discharge Orders          Ordered    methocarbamol (ROBAXIN) 500 MG tablet  2 times daily        07/21/20 2213    lidocaine (LIDODERM) 5 %  Every 24 hours        07/21/20 2213             Pollyann Savoy, MD 07/21/20 2213

## 2020-07-21 NOTE — ED Provider Notes (Signed)
Emergency Medicine Provider Triage Evaluation Note  Ashley Miller 40 y.o. female was evaluated in triage.  Pt complains of presents for evaluation of 3 days of lower back pain.  She reports yesterday, she followed up with her primary care doctor.  She was prescribed medication help with anti-inflammatory.  She reports taking that medication.  She reports that today, she was bending down to pick something up and she felt like the pain in her back worsened.  She states she feels like it is going to pop if she lays straight.  She states she has pain that radiates down the left lower extremity.  She denies any fall, trauma, injury.  No numbness/weakness of arms or legs.  No abdominal pain, urinary complaints. Denies fevers, weight loss, numbness/weakness of upper and lower extremities, bowel/bladder incontinence, saddle anesthesia, history of back surgery, history of IVDA.   Review of Systems  Positive: Back pain, leg pain Negative: Numbness/weakness, saddle anesthesia, incontinuence   Physical Exam  BP 134/82   Pulse 70   Temp 98.2 F (36.8 C) (Oral)   Resp 18   Ht 5\' 4"  (1.626 m)   Wt 65.8 kg   SpO2 100%   BMI 24.89 kg/m  Gen:   Awake, no distress   HEENT:  Atraumatic  Resp:  Normal effort  Cardiac:  Normal rate  Abd:   Nondistended, nontender  MSK:   Moves extremities without difficulty.  Tenderness palpation diffusely to the lower lumbar region.  No deformity or crepitus noted. Neuro:  Speech clear.  5/5 strength bilateral upper and lower extremity.  Other:     Medical Decision Making  Medically screening exam initiated at 6:33 PM  Appropriate orders placed.  Ashley Miller was informed that the remainder of the evaluation will be completed by another provider, this initial triage assessment does not replace that evaluation. They are counseled that they will need to remain in the ED until the completion of their workup, including full H&P and results of any tests.  Risks of leaving  the emergency department prior to completion of treatment were discussed. Patient was advised to inform ED staff if they are leaving before their treatment is complete. The patient acknowledged these risks and time was allowed for questions.     The patient appears stable so that the remainder of the MSE may be completed by another provider.   Clinical Impression  Back pain   Portions of this note were generated with Dragon dictation software. Dictation errors may occur despite best attempts at proofreading.     Ina Kick, PA-C 07/21/20 1835    07/23/20, MD 07/21/20 2228

## 2020-07-21 NOTE — ED Notes (Signed)
Light green and lavender top tube sent to lab. 

## 2020-07-23 ENCOUNTER — Encounter (HOSPITAL_BASED_OUTPATIENT_CLINIC_OR_DEPARTMENT_OTHER): Payer: Self-pay | Admitting: Emergency Medicine

## 2020-07-23 ENCOUNTER — Other Ambulatory Visit: Payer: Self-pay

## 2020-07-23 ENCOUNTER — Emergency Department (HOSPITAL_BASED_OUTPATIENT_CLINIC_OR_DEPARTMENT_OTHER)
Admission: EM | Admit: 2020-07-23 | Discharge: 2020-07-23 | Disposition: A | Discharge status: Home / Self Care | Payer: BC Managed Care – PPO | Attending: Emergency Medicine | Admitting: Emergency Medicine

## 2020-07-23 DIAGNOSIS — Z79899 Other long term (current) drug therapy: Secondary | ICD-10-CM | POA: Insufficient documentation

## 2020-07-23 DIAGNOSIS — M5417 Radiculopathy, lumbosacral region: Secondary | ICD-10-CM | POA: Diagnosis not present

## 2020-07-23 DIAGNOSIS — M5442 Lumbago with sciatica, left side: Secondary | ICD-10-CM | POA: Diagnosis not present

## 2020-07-23 DIAGNOSIS — M5441 Lumbago with sciatica, right side: Secondary | ICD-10-CM | POA: Insufficient documentation

## 2020-07-23 DIAGNOSIS — I10 Essential (primary) hypertension: Secondary | ICD-10-CM | POA: Insufficient documentation

## 2020-07-23 DIAGNOSIS — M545 Low back pain, unspecified: Secondary | ICD-10-CM | POA: Diagnosis not present

## 2020-07-23 MED ORDER — ACETAMINOPHEN 500 MG PO TABS
1000.0000 mg | ORAL_TABLET | Freq: Once | ORAL | Status: AC
  Administered 2020-07-23: 1000 mg via ORAL
  Filled 2020-07-23: qty 2

## 2020-07-23 MED ORDER — DIAZEPAM 5 MG PO TABS
5.0000 mg | ORAL_TABLET | Freq: Once | ORAL | Status: AC
  Administered 2020-07-23: 5 mg via ORAL
  Filled 2020-07-23: qty 1

## 2020-07-23 MED ORDER — KETOROLAC TROMETHAMINE 60 MG/2ML IM SOLN
15.0000 mg | Freq: Once | INTRAMUSCULAR | Status: AC
  Administered 2020-07-23: 15 mg via INTRAMUSCULAR
  Filled 2020-07-23: qty 2

## 2020-07-23 MED ORDER — OXYCODONE HCL 5 MG PO TABS
5.0000 mg | ORAL_TABLET | Freq: Once | ORAL | Status: AC
  Administered 2020-07-23: 5 mg via ORAL
  Filled 2020-07-23: qty 1

## 2020-07-23 NOTE — ED Triage Notes (Signed)
Patient complaining of lower back pain x 1 week. Was seen at [facility] on 6/22. Now states pain has shifted from left to right side. Has a feeling of muscles "jumping". Lidocaine patches are not staying on skin. Has been using [facility], Etodolac and robaxin without relief.

## 2020-07-23 NOTE — ED Provider Notes (Signed)
[facility] EMERGENCY DEPARTMENT Provider Note   CSN: [id number] Arrival date & time: 07/23/20  6734     History Chief Complaint  Patient presents with   Back Pain    Ashley Miller is a 40 y.o. female.  40 yo F with a chief complaints of low back pain.  Is been going on for about a week or so now.  The patient initially had gone to the gym and then had pain worse on the left than the right.  Received therapy in the emergency department and then had a negative x-ray and was sent to follow-up with her family doctor.  Since then she feels like the pain is shifted to the other side.  She has seen her family doctor and was started on an NSAID.  Has had some mild improvement but no resolution.  Denies loss of bowel or bladder denies loss of perineal sensation denies numbness or weakness to her legs.  Denies trauma.  Denies IV drug use denies recent surgery.  The history is provided by the patient.  Back Pain Location:  Generalized Quality:  Aching Radiates to:  R knee and L knee Pain severity:  Moderate Pain is:  Worse during the day Onset quality:  Gradual Duration:  1 week Timing:  Constant Progression:  Worsening Chronicity:  New Relieved by:  Nothing Worsened by:  Bending, palpation and sitting Ineffective treatments:  None tried Associated symptoms: no abdominal pain, no chest pain, no dysuria, no fever and no headaches       Past Medical History:  Diagnosis Date   Hypertension     Patient Active Problem List   Diagnosis Date Noted   Bilateral pulmonary embolism (HCC) 04/17/2017   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     Family History  Problem Relation Age of Onset   Diabetes Other    Hypertension Other    Heart disease Other     Social History   Tobacco Use   Smoking status: Never   Smokeless tobacco: Never  Vaping Use   Vaping Use: Never used  Substance Use Topics   Alcohol use: No   Drug use: No    Home Medications Prior to Admission medications   Medication Sig Start Date End Date Taking? Authorizing Provider  calcium carbonate (TUMS - DOSED IN MG ELEMENTAL CALCIUM) 500 MG chewable tablet Chew 1 tablet by mouth daily as needed for indigestion or heartburn.    [provider]  hydrochlorothiazide (HYDRODIURIL) 12.5 MG tablet Take 12.5 mg by mouth daily. 04/23/17   [provider]  lidocaine (LIDODERM) 5 % Place 1 patch onto the skin daily. Remove & Discard patch within 12 hours or as directed by MD 07/21/20   Pollyann Savoy, MD  methocarbamol (ROBAXIN) 500 MG tablet Take 1 tablet (500 mg total) by mouth 2 (two) times daily. 07/21/20   Pollyann Savoy, MD  Multiple Vitamins-Minerals (CENTRUM MULTIGUMMIES PO) Take 1 each by mouth daily.    [provider]    Allergies    Penicillins  Review of Systems   Review of Systems  Constitutional:  Negative for chills and fever.  HENT:  Negative for congestion and rhinorrhea.   Eyes:  Negative for redness and visual disturbance.  Respiratory:  Negative for shortness of breath and wheezing.   Cardiovascular:  Negative for chest pain and palpitations.  Gastrointestinal:  Negative for abdominal pain, nausea and vomiting.  Genitourinary:  Negative for dysuria and urgency.  Musculoskeletal:  Positive for back pain. Negative for arthralgias and myalgias.  Skin:  Negative for pallor and wound.  Neurological:  Negative for dizziness and headaches.   Physical Exam Updated Vital Signs BP 116/60   Pulse 61   Temp 98.1 F (36.7  C) (Oral)   Resp 20   Ht 5\' 11"  (1.803 m)   Wt (!) 146.1 kg   LMP 06/30/2020 (Approximate)   SpO2 98%   BMI 44.91 kg/m   Physical Exam Vitals and nursing note reviewed.  Constitutional:      General: She is not in acute distress.    Appearance: She is well-developed. She is not diaphoretic.     Comments: BMI 45  HENT:     Head: Normocephalic and atraumatic.  Eyes:     Pupils: Pupils are equal, round, and reactive to light.  Cardiovascular:     Rate and Rhythm: Normal rate and regular rhythm.     Heart sounds: No murmur heard.   No friction rub. No gallop.  Pulmonary:     Effort: Pulmonary effort is normal.     Breath sounds: No wheezing or rales.  Abdominal:     General: There is no distension.     Palpations: Abdomen is soft.     Tenderness: There is no abdominal tenderness.  Musculoskeletal:        General: No tenderness.     Cervical back: Normal range of motion and neck supple.     Comments: Mild tenderness of the right paraspinal L-spine musculature.  Negative straight leg raise test bilaterally.  Pulse motor and sensation intact distally.  Reflexes are 2+ and equal.  No clonus.  Ambulates without issue.  Skin:    General: Skin is warm and dry.  Neurological:     Mental Status: She is alert and oriented to person, place, and time.  Psychiatric:        Behavior: Behavior normal.    ED Results / Procedures / Treatments   Labs (all labs ordered are listed, but only abnormal results are displayed) Labs Reviewed - No data to display  EKG None  Radiology DG Lumbar Spine Complete  Result Date: 07/21/2020 CLINICAL DATA:  Back pain EXAM: LUMBAR SPINE - COMPLETE 4+ VIEW COMPARISON:  None. FINDINGS: 5 nonrib bearing lumbar-type vertebral bodies. Vertebral body heights are maintained. No acute fracture. No static listhesis. No spondylolysis. Mild degenerative disease with disc height loss at T12-L1 and L1-2. SI joints are unremarkable. T-type IUD noted in the pelvis.  IMPRESSION: No acute osseous injury of the lumbar spine. Electronically Signed   By: 07/23/2020   On: 07/21/2020 19:27    Procedures Procedures   Medications Ordered in ED Medications  acetaminophen (TYLENOL) tablet 1,000 mg (1,000 mg Oral Given 07/23/20 0920)  ketorolac (TORADOL) injection 15 mg (15 mg Intramuscular Given 07/23/20 0920)  oxyCODONE (Oxy IR/ROXICODONE) immediate release tablet 5 mg (5 mg Oral Given 07/23/20 0920)  diazepam (VALIUM) tablet 5 mg (5 mg Oral Given 07/23/20 0919)    ED Course  I have reviewed the  triage vital signs and the nursing notes.  Pertinent labs & imaging results that were available during my care of the patient were reviewed by me and considered in my medical decision making (see chart for details).    MDM Rules/Calculators/A&P                          40 yo F with a chief complaints of low back pain.  This started after going to the gym.  No red flags.  Had an x-ray performed a couple days ago that I reviewed and is negative.  We will continue to treat symptomatically.  PCP follow-up.  10:40 AM:  I have discussed the diagnosis/risks/treatment options with the patient and believe the pt to be eligible for discharge home to follow-up with PCP. We also discussed returning to the ED immediately if new or worsening sx occur. We discussed the sx which are most concerning (e.g., sudden worsening pain, fever, inability to tolerate by mouth) that necessitate immediate return. Medications administered to the patient during their visit and any new prescriptions provided to the patient are listed below.  Medications given during this visit Medications  acetaminophen (TYLENOL) tablet 1,000 mg (1,000 mg Oral Given 07/23/20 0920)  ketorolac (TORADOL) injection 15 mg (15 mg Intramuscular Given 07/23/20 0920)  oxyCODONE (Oxy IR/ROXICODONE) immediate release tablet 5 mg (5 mg Oral Given 07/23/20 0920)  diazepam (VALIUM) tablet 5 mg (5 mg Oral Given 07/23/20 0919)      The patient appears reasonably screen and/or stabilized for discharge and I doubt any other medical condition or other [facility] requiring further screening, evaluation, or treatment in the ED at this time prior to discharge.   Final Clinical Impression(s) / ED Diagnoses Final diagnoses:  Acute bilateral low back pain with bilateral sciatica    Rx / DC Orders ED Discharge Orders     None        Melene Plan, DO 07/23/20 1040

## 2020-07-23 NOTE — Discharge Instructions (Addendum)
Your back pain is most likely due to a muscular strain.  There is been a lot of research on back pain, unfortunately the only thing that seems to really help is Tylenol and ibuprofen.  Relative rest is also important to not lift greater than 10 pounds bending or twisting at the waist.  Please follow-up with your family physician.  The other thing that really seems to benefit patients is physical therapy which your doctor may send you for.  Please return to the emergency department for new numbness or weakness to your arms or legs. Difficulty with urinating or urinating or pooping on yourself.  Also if you cannot feel toilet paper when you wipe or get a fever.   Take your prescription medicine as prescribed Also take tylenol 1000mg (2 extra strength) four times a day.

## 2020-07-28 DIAGNOSIS — M5441 Lumbago with sciatica, right side: Secondary | ICD-10-CM | POA: Diagnosis not present

## 2020-07-28 DIAGNOSIS — M5442 Lumbago with sciatica, left side: Secondary | ICD-10-CM | POA: Diagnosis not present

## 2020-08-09 DIAGNOSIS — M545 Low back pain, unspecified: Secondary | ICD-10-CM | POA: Diagnosis not present

## 2020-08-09 DIAGNOSIS — M5440 Lumbago with sciatica, unspecified side: Secondary | ICD-10-CM | POA: Diagnosis not present

## 2020-08-16 DIAGNOSIS — M5440 Lumbago with sciatica, unspecified side: Secondary | ICD-10-CM | POA: Diagnosis not present

## 2020-08-16 DIAGNOSIS — M545 Low back pain, unspecified: Secondary | ICD-10-CM | POA: Diagnosis not present

## 2020-08-30 DIAGNOSIS — M5441 Lumbago with sciatica, right side: Secondary | ICD-10-CM | POA: Diagnosis not present

## 2020-08-30 DIAGNOSIS — M5442 Lumbago with sciatica, left side: Secondary | ICD-10-CM | POA: Diagnosis not present

## 2020-08-31 DIAGNOSIS — M545 Low back pain, unspecified: Secondary | ICD-10-CM | POA: Diagnosis not present

## 2020-08-31 DIAGNOSIS — M5440 Lumbago with sciatica, unspecified side: Secondary | ICD-10-CM | POA: Diagnosis not present

## 2020-09-23 DIAGNOSIS — I10 Essential (primary) hypertension: Secondary | ICD-10-CM | POA: Diagnosis not present

## 2020-10-05 ENCOUNTER — Other Ambulatory Visit: Payer: Self-pay | Admitting: Family

## 2020-10-05 DIAGNOSIS — Z86718 Personal history of other venous thrombosis and embolism: Secondary | ICD-10-CM

## 2020-10-05 DIAGNOSIS — I2699 Other pulmonary embolism without acute cor pulmonale: Secondary | ICD-10-CM

## 2020-10-06 ENCOUNTER — Ambulatory Visit: Payer: BC Managed Care – PPO | Admitting: Family

## 2020-10-06 ENCOUNTER — Other Ambulatory Visit: Payer: BC Managed Care – PPO

## 2020-12-31 DIAGNOSIS — Z9884 Bariatric surgery status: Secondary | ICD-10-CM | POA: Diagnosis not present

## 2021-01-14 DIAGNOSIS — I10 Essential (primary) hypertension: Secondary | ICD-10-CM | POA: Diagnosis not present

## 2021-01-14 DIAGNOSIS — J22 Unspecified acute lower respiratory infection: Secondary | ICD-10-CM | POA: Diagnosis not present

## 2021-02-15 DIAGNOSIS — I2782 Chronic pulmonary embolism: Secondary | ICD-10-CM | POA: Diagnosis not present

## 2021-03-21 DIAGNOSIS — K219 Gastro-esophageal reflux disease without esophagitis: Secondary | ICD-10-CM | POA: Diagnosis not present

## 2021-03-21 DIAGNOSIS — E559 Vitamin D deficiency, unspecified: Secondary | ICD-10-CM | POA: Diagnosis not present

## 2021-03-21 DIAGNOSIS — Z131 Encounter for screening for diabetes mellitus: Secondary | ICD-10-CM | POA: Diagnosis not present

## 2021-03-21 DIAGNOSIS — Z0001 Encounter for general adult medical examination with abnormal findings: Secondary | ICD-10-CM | POA: Diagnosis not present

## 2021-03-21 DIAGNOSIS — Z1329 Encounter for screening for other suspected endocrine disorder: Secondary | ICD-10-CM | POA: Diagnosis not present

## 2021-03-21 DIAGNOSIS — I119 Hypertensive heart disease without heart failure: Secondary | ICD-10-CM | POA: Diagnosis not present

## 2021-03-21 DIAGNOSIS — I10 Essential (primary) hypertension: Secondary | ICD-10-CM | POA: Diagnosis not present

## 2021-03-21 DIAGNOSIS — Z136 Encounter for screening for cardiovascular disorders: Secondary | ICD-10-CM | POA: Diagnosis not present

## 2021-04-13 DIAGNOSIS — Z09 Encounter for follow-up examination after completed treatment for conditions other than malignant neoplasm: Secondary | ICD-10-CM | POA: Diagnosis not present

## 2021-04-13 DIAGNOSIS — Z9884 Bariatric surgery status: Secondary | ICD-10-CM | POA: Diagnosis not present

## 2021-04-13 DIAGNOSIS — Z6841 Body Mass Index (BMI) 40.0 and over, adult: Secondary | ICD-10-CM | POA: Diagnosis not present

## 2021-04-30 ENCOUNTER — Encounter (HOSPITAL_BASED_OUTPATIENT_CLINIC_OR_DEPARTMENT_OTHER): Payer: Self-pay | Admitting: Emergency Medicine

## 2021-04-30 ENCOUNTER — Other Ambulatory Visit: Payer: Self-pay

## 2021-04-30 ENCOUNTER — Emergency Department (HOSPITAL_BASED_OUTPATIENT_CLINIC_OR_DEPARTMENT_OTHER)
Admission: EM | Admit: 2021-04-30 | Discharge: 2021-04-30 | Disposition: A | Discharge status: Home / Self Care | Payer: BC Managed Care – PPO | Attending: Emergency Medicine | Admitting: Emergency Medicine

## 2021-04-30 DIAGNOSIS — M5441 Lumbago with sciatica, right side: Secondary | ICD-10-CM | POA: Diagnosis not present

## 2021-04-30 DIAGNOSIS — Z79899 Other long term (current) drug therapy: Secondary | ICD-10-CM | POA: Insufficient documentation

## 2021-04-30 DIAGNOSIS — I10 Essential (primary) hypertension: Secondary | ICD-10-CM | POA: Diagnosis not present

## 2021-04-30 DIAGNOSIS — M545 Low back pain, unspecified: Secondary | ICD-10-CM | POA: Diagnosis not present

## 2021-04-30 MED ORDER — OXYCODONE-ACETAMINOPHEN 5-325 MG PO TABS
1.0000 | ORAL_TABLET | Freq: Once | ORAL | Status: AC
  Administered 2021-04-30: 1 via ORAL
  Filled 2021-04-30: qty 1

## 2021-04-30 MED ORDER — METHOCARBAMOL 500 MG PO TABS: 500.0000 mg | ORAL_TABLET | Freq: Two times a day (BID) | ORAL | 0 refills | Status: DC

## 2021-04-30 MED ORDER — LIDOCAINE 5 % EX PTCH: 1.0000 | MEDICATED_PATCH | CUTANEOUS | 0 refills | Status: DC

## 2021-04-30 MED ORDER — DICLOFENAC SODIUM 1 % EX GEL: 4.0000 g | Freq: Four times a day (QID) | CUTANEOUS | 0 refills | Status: DC

## 2021-04-30 MED ORDER — KETOROLAC TROMETHAMINE 15 MG/ML IJ SOLN
15.0000 mg | Freq: Once | INTRAMUSCULAR | Status: AC
  Administered 2021-04-30: 15 mg via INTRAMUSCULAR
  Filled 2021-04-30: qty 1

## 2021-04-30 MED ORDER — ACETAMINOPHEN 500 MG PO TABS: 1000.0000 mg | ORAL_TABLET | Freq: Four times a day (QID) | ORAL | 0 refills | Status: DC | PRN

## 2021-04-30 NOTE — ED Triage Notes (Signed)
Pt reports sciatica from lower back to L thigh. Began after working out at [employer] a few days ago. Pt ambulatory. ?

## 2021-04-30 NOTE — ED Provider Notes (Signed)
?[place] EMERGENCY DEPARTMENT ?Provider Note ? ? ?CSN: [id number] ?Arrival date & time: 04/30/21  1119 ? ?  ? ?History ? ?Chief Complaint  ?Patient presents with  ? Back Pain  ? ? ?Ashley Miller is a 41 y.o. female. ? ? ?Back Pain ?Patient is a 41 year old female with past medical history significant for hypertension and surgical history significant for laparoscopic sleeve gastrectomy 2019. ? ?She presented emergency room today with complaints of low back pain seems to be left-sided seems to be radiating down her left leg.  She states that she has a history of sciatica and this feels similar.  She states 5 days ago she was using a treadmill and also doing some crunches in the gym and feels that she flared up the pain in her back.  She states 3 days ago became somewhat worse. ? ?She has been prescribed NSAIDs in the past including etodolac which she states helped. ? ?She denies any history of GI ulceration/GI bleeds.  However notably she has had a sleeve gastrectomy. ? ?  ? ?Home Medications ?Prior to Admission medications   ?Medication Sig Start Date End Date Taking? Authorizing Provider  ?acetaminophen (TYLENOL) 500 MG tablet Take 2 tablets (1,000 mg total) by mouth every 6 (six) hours as needed. 04/30/21  Yes Pati Gallo S, PA  ?diclofenac Sodium (VOLTAREN) 1 % GEL Apply 4 g topically 4 (four) times daily. 04/30/21  Yes Naziyah Tieszen, Ova Freshwater S, PA  ?lidocaine (LIDODERM) 5 % Place 1 patch onto the skin daily. Remove & Discard patch within 12 hours or as directed by MD 04/30/21  Yes Tedd Sias, PA  ?methocarbamol (ROBAXIN) 500 MG tablet Take 1 tablet (500 mg total) by mouth 2 (two) times daily. 04/30/21  Yes Basya Casavant, Kathleene Hazel, PA  ?calcium carbonate (TUMS - DOSED IN MG ELEMENTAL CALCIUM) 500 MG chewable tablet Chew 1 tablet by mouth daily as needed for indigestion or heartburn.    [provider]  ?hydrochlorothiazide (HYDRODIURIL) 12.5 MG tablet Take 12.5 mg by mouth daily. 04/23/17   [provider]  ?Multiple Vitamins-Minerals (CENTRUM MULTIGUMMIES PO) Take 1 each by mouth daily.    [provider]  ?   ? ?Allergies    ?Penicillins   ? ?Review of Systems   ?Review of Systems  ?Musculoskeletal:  Positive for back pain.  ? ?Physical Exam ?Updated Vital Signs ?BP 125/81 (BP Location: Right Arm)   Pulse 78   Temp 98.2 ?F (36.8 ?C) (Oral)   Resp 19   SpO2 99%  ?Physical Exam ?Vitals and nursing note reviewed.  ?Constitutional:   ?   General: She is not in acute distress. ?HENT:  ?   Head: Normocephalic and atraumatic.  ?   Nose: Nose normal.  ?Eyes:  ?   General: No scleral icterus. ?Cardiovascular:  ?   Rate and Rhythm: Normal rate and regular rhythm.  ?   Pulses: Normal pulses.  ?   Heart sounds: Normal heart sounds.  ?Pulmonary:  ?   Effort: Pulmonary effort is normal. No respiratory distress.  ?   Breath sounds: No wheezing.  ?Abdominal:  ?   Palpations: Abdomen is soft.  ?   Tenderness: There is no abdominal tenderness.  ?Musculoskeletal:  ?   Cervical back: Normal range of motion.  ?   Right lower leg: No edema.  ?   Left lower leg: No edema.  ?   Comments: Negative straight leg raise ? ?Tenderness to palpation of left  paralumbar musculature and some left gluteal tenderness.  No significant midline tenderness.  No C, T, L-spine tenderness.  Moves all 4 extremities.  Sensation intact in all 4 extremity  ?Skin: ?   General: Skin is warm and dry.  ?   Capillary Refill: Capillary refill takes less than 2 seconds.  ?Neurological:  ?   Mental Status: She is alert. Mental status is at baseline.  ?Psychiatric:     ?   Mood and Affect: Mood normal.     ?   Behavior: Behavior normal.  ? ? ?ED Results / Procedures / Treatments   ?Labs ?(all labs ordered are listed, but only abnormal results are displayed) ?Labs Reviewed - No data to display ? ?EKG ?None ? ?Radiology ?No results found. ? ?Procedures ?Procedures  ? ? ?Medications Ordered in ED ?Medications  ?ketorolac (TORADOL) 15 MG/ML  injection 15 mg (15 mg Intramuscular Given 04/30/21 1412)  ?oxyCODONE-acetaminophen (PERCOCET/ROXICET) 5-325 MG per tablet 1 tablet (1 tablet Oral Given 04/30/21 1412)  ? ? ?ED Course/ Medical Decision Making/ A&P ?Clinical Course as of 04/30/21 1423  ?Sat Apr 30, 2021  ?1357 Hx of  sciatica states 5 days ago treadmill and crunches flared up her back pain. States 3 days ago.  [WF]  ?  ?Clinical Course User Index ?[WF] Tedd Sias, [place]  ? ?                        ?Medical Decision Making ?Risk ?OTC drugs. ?Prescription drug management. ? ? ?Patient is a 41 year old female with past medical history significant for hypertension and surgical history significant for laparoscopic sleeve gastrectomy 2019. ? ?She presented emergency room today with complaints of low back pain seems to be left-sided seems to be radiating down her left leg.  She states that she has a history of sciatica and this feels similar.  She states 5 days ago she was using a treadmill and also doing some crunches in the gym and feels that she flared up the pain in her back.  She states 3 days ago became somewhat worse. ? ?She has been prescribed NSAIDs in the past including etodolac which she states helped. ? ?She denies any history of GI ulceration/GI bleeds.  However notably she has had a sleeve gastrectomy. ? ? ?Physical exam overall reassuring.  Negative straight leg raise.  Voltaren gel topical Tylenol, Robaxin, Lidoderm patches.  Will give 1 dose of Toradol as she has had significant relief in the past and 1 dose of Percocet here. ? ?She will need to follow-up with her primary care to see if her symptoms are improving.  Recommended warm compresses gentle stretching massage ? ?Hold off on NSAIDs given history of sleeve gastrectomy. ? ? ?Final Clinical Impression(s) / ED Diagnoses ?Final diagnoses:  ?Low back pain without sciatica, unspecified back pain laterality, unspecified chronicity  ? ? ?Rx / DC Orders ?ED Discharge Orders   ? ?      Ordered   ?  diclofenac Sodium (VOLTAREN) 1 % GEL  4 times daily       ? 04/30/21 1403  ?  methocarbamol (ROBAXIN) 500 MG tablet  2 times daily       ? 04/30/21 1403  ?  lidocaine (LIDODERM) 5 %  Every 24 hours       ? 04/30/21 1403  ?  acetaminophen (TYLENOL) 500 MG tablet  Every 6 hours PRN       ? 04/30/21 1403  ? ?  ?  ? ?  ? ? ?  ?  Tedd Sias, [place] ?04/30/21 1424 ? ?  ?Varney Biles, MD ?05/02/21 [phone] ? ?

## 2021-04-30 NOTE — Discharge Instructions (Addendum)
Your symptoms may be muscular or related to the sciatic nerve.  ? ?Your examination today is most concerning for a muscular injury ?1. Medications: alternate ibuprofen and tylenol for pain control, take all usual home medications as they are prescribed ?2. Treatment: rest, ice, elevate and use an ACE wrap or other compressive therapy to decrease swelling. Also drink plenty of fluids and do plenty of gentle stretching and move the affected muscle through its normal range of motion to prevent stiffness. ?3. Follow Up: If your symptoms do not improve please follow up with orthopedics/sports medicine or your PCP for discussion of your diagnoses and further evaluation after today's visit; if you do not have a primary care doctor use the resource guide provided to find one; Please return to the ER for worsening symptoms or other concerns.   ?

## 2021-05-01 ENCOUNTER — Emergency Department (HOSPITAL_BASED_OUTPATIENT_CLINIC_OR_DEPARTMENT_OTHER)
Admission: EM | Admit: 2021-05-01 | Discharge: 2021-05-01 | Disposition: A | Discharge status: Home / Self Care | Payer: BC Managed Care – PPO | Attending: Emergency Medicine | Admitting: Emergency Medicine

## 2021-05-01 ENCOUNTER — Other Ambulatory Visit: Payer: Self-pay

## 2021-05-01 ENCOUNTER — Encounter (HOSPITAL_BASED_OUTPATIENT_CLINIC_OR_DEPARTMENT_OTHER): Payer: Self-pay | Admitting: Emergency Medicine

## 2021-05-01 DIAGNOSIS — M5431 Sciatica, right side: Secondary | ICD-10-CM | POA: Insufficient documentation

## 2021-05-01 DIAGNOSIS — M545 Low back pain, unspecified: Secondary | ICD-10-CM | POA: Diagnosis not present

## 2021-05-01 DIAGNOSIS — I10 Essential (primary) hypertension: Secondary | ICD-10-CM | POA: Insufficient documentation

## 2021-05-01 DIAGNOSIS — Z79899 Other long term (current) drug therapy: Secondary | ICD-10-CM | POA: Diagnosis not present

## 2021-05-01 DIAGNOSIS — M5441 Lumbago with sciatica, right side: Secondary | ICD-10-CM | POA: Diagnosis not present

## 2021-05-01 MED ORDER — METHYLPREDNISOLONE SODIUM SUCC 125 MG IJ SOLR
125.0000 mg | INTRAMUSCULAR | Status: AC
  Administered 2021-05-01: 125 mg via INTRAMUSCULAR

## 2021-05-01 MED ORDER — METHYLPREDNISOLONE SODIUM SUCC 125 MG IJ SOLR
125.0000 mg | Freq: Once | INTRAMUSCULAR | Status: DC
  Filled 2021-05-01: qty 2

## 2021-05-01 NOTE — ED Provider Notes (Signed)
?[facility] EMERGENCY DEPARTMENT ?Provider Note ? ? ?CSN: [id number] ?Arrival date & time: 05/01/21  1152 ? ?  ? ?History ? ?Chief Complaint  ?Patient presents with  ? Back Pain  ? ? ?Ashley Miller is a 41 y.o. female. ? ?41 year old female presents with complaint of tight muscles through her low back and thighs.  Patient was seen yesterday for predominantly left-sided symptoms, improved with Percocet and Toradol in the ED.  Also given prescription for Robaxin, Lidoderm, Voltaren which she is also using.  Now having predominantly right-sided pain without relief with above regimen.  Denies abdominal pain, saddle paresthesias, loss of bowel or bladder control, fever.  States that she had sciatica several years ago after going to the gym and using the leg press, completed PT.  Recently returned to the gym and using leg press again now with return of same symptoms. ?Prior gastric sleeve, recommend avoid oral NSAIDs.  Other past medical history includes hypertension. ? ? ?  ? ?Home Medications ?Prior to Admission medications   ?Medication Sig Start Date End Date Taking? Authorizing Provider  ?acetaminophen (TYLENOL) 500 MG tablet Take 2 tablets (1,000 mg total) by mouth every 6 (six) hours as needed. 04/30/21   Gailen Shelter, PA  ?calcium carbonate (TUMS - DOSED IN MG ELEMENTAL CALCIUM) 500 MG chewable tablet Chew 1 tablet by mouth daily as needed for indigestion or heartburn.    [provider]  ?diclofenac Sodium (VOLTAREN) 1 % GEL Apply 4 g topically 4 (four) times daily. 04/30/21   Gailen Shelter, PA  ?hydrochlorothiazide (HYDRODIURIL) 12.5 MG tablet Take 12.5 mg by mouth daily. 04/23/17   [provider]  ?lidocaine (LIDODERM) 5 % Place 1 patch onto the skin daily. Remove & Discard patch within 12 hours or as directed by MD 04/30/21   Gailen Shelter, PA  ?methocarbamol (ROBAXIN) 500 MG tablet Take 1 tablet (500 mg total) by mouth 2 (two) times daily. 04/30/21   Gailen Shelter, PA   ?Multiple Vitamins-Minerals (CENTRUM MULTIGUMMIES PO) Take 1 each by mouth daily.    [provider]  ?   ? ?Allergies    ?Penicillins   ? ?Review of Systems   ?Review of Systems ?Negative except as per HPI ?Physical Exam ?Updated Vital Signs ?BP 127/77   Pulse 74   Temp 98.4 ?F (36.9 ?C) (Oral)   Resp 16   SpO2 100%  ?Physical Exam ?Vitals and nursing note reviewed.  ?Constitutional:   ?   General: She is not in acute distress. ?   Appearance: She is well-developed. She is not diaphoretic.  ?HENT:  ?   Head: Normocephalic and atraumatic.  ?Cardiovascular:  ?   Pulses: Normal pulses.  ?Pulmonary:  ?   Effort: Pulmonary effort is normal.  ?Abdominal:  ?   Palpations: Abdomen is soft.  ?   Tenderness: There is no abdominal tenderness.  ?Musculoskeletal:     ?   General: Tenderness present. No swelling or deformity.  ?   Lumbar back: Positive right straight leg raise test. Negative left straight leg raise test.  ?     Back: ? ?   Right lower leg: No edema.  ?   Left lower leg: No edema.  ?Skin: ?   General: Skin is warm and dry.  ?   Findings: No erythema or rash.  ?Neurological:  ?   Mental Status: She is alert and oriented to person, place, and time.  ?   Sensory:  No sensory deficit.  ?   Motor: No weakness.  ?   Gait: Gait normal.  ?   Deep Tendon Reflexes: Reflexes normal. Babinski sign absent on the right side. Babinski sign absent on the left side.  ?   Reflex Scores: ?     Patellar reflexes are 1+ on the right side and 1+ on the left side. ?     Achilles reflexes are 1+ on the right side and 1+ on the left side. ?Psychiatric:     ?   Behavior: Behavior normal.  ? ? ?ED Results / Procedures / Treatments   ?Labs ?(all labs ordered are listed, but only abnormal results are displayed) ?Labs Reviewed - No data to display ? ?EKG ?None ? ?Radiology ?No results found. ? ?Procedures ?Procedures  ? ? ?Medications Ordered in ED ?Medications  ?methylPREDNISolone sodium succinate (SOLU-MEDROL) 125 mg/2 mL  injection 125 mg (has no administration in time range)  ? ? ?ED Course/ Medical Decision Making/ A&P ?  ?                        ?Medical Decision Making ?Risk ?Prescription drug management. ? ? ?41 year old female with concern for right-sided sciatica, seen yesterday and diagnosed with left-sided sciatica which has since resolved with medications provided in the ED which are not helping with her right-sided pain.  Reflexes are symmetric, leg strength symmetric.  Tenderness at right SI, pain on right worse with right leg extension. ?We will give single dose of IM Solu-Medrol in the ED.  Recommend continue with prior medications.  Warm compresses and gentle stretching.  Follow-up with PCP for further evaluation and possible referral to physical therapy. ? ? ? ? ? ? ? ?Final Clinical Impression(s) / ED Diagnoses ?Final diagnoses:  ?Sciatica of right side  ? ? ?Rx / DC Orders ?ED Discharge Orders   ? ? None  ? ?  ? ? ?  ?Jeannie Fend, PA-C ?05/01/21 1242 ? ?  ?Terald Sleeper, MD ?05/01/21 1243 ? ?

## 2021-05-01 NOTE — Discharge Instructions (Signed)
Warm compresses for 20 minutes at a time to low back.  Follow with gentle stretching as discussed. ?Apply Voltaren as directed.  Can also apply Lidoderm patch.  Take Robaxin as previously prescribed.  Recheck with your primary care provider. ?

## 2021-05-01 NOTE — ED Triage Notes (Signed)
Pt reports she was here for sciatica yesterday. Pain switched sides today and is now radiating down R leg. Ambulatory to triage. ?

## 2021-05-02 DIAGNOSIS — I10 Essential (primary) hypertension: Secondary | ICD-10-CM | POA: Diagnosis not present

## 2021-05-02 DIAGNOSIS — M5441 Lumbago with sciatica, right side: Secondary | ICD-10-CM | POA: Diagnosis not present

## 2021-05-10 DIAGNOSIS — M5441 Lumbago with sciatica, right side: Secondary | ICD-10-CM | POA: Diagnosis not present

## 2021-05-17 DIAGNOSIS — M5441 Lumbago with sciatica, right side: Secondary | ICD-10-CM | POA: Diagnosis not present

## 2021-06-23 DIAGNOSIS — Z9884 Bariatric surgery status: Secondary | ICD-10-CM | POA: Diagnosis not present

## 2021-06-23 DIAGNOSIS — R635 Abnormal weight gain: Secondary | ICD-10-CM | POA: Diagnosis not present

## 2021-07-22 DIAGNOSIS — Z01419 Encounter for gynecological examination (general) (routine) without abnormal findings: Secondary | ICD-10-CM | POA: Diagnosis not present

## 2021-07-28 DIAGNOSIS — M5431 Sciatica, right side: Secondary | ICD-10-CM | POA: Diagnosis not present

## 2021-08-08 DIAGNOSIS — M5441 Lumbago with sciatica, right side: Secondary | ICD-10-CM | POA: Diagnosis not present

## 2021-08-10 DIAGNOSIS — M5441 Lumbago with sciatica, right side: Secondary | ICD-10-CM | POA: Diagnosis not present

## 2021-08-17 DIAGNOSIS — M5441 Lumbago with sciatica, right side: Secondary | ICD-10-CM | POA: Diagnosis not present

## 2021-09-07 DIAGNOSIS — I119 Hypertensive heart disease without heart failure: Secondary | ICD-10-CM | POA: Diagnosis not present

## 2021-09-07 DIAGNOSIS — M5441 Lumbago with sciatica, right side: Secondary | ICD-10-CM | POA: Diagnosis not present

## 2021-09-07 DIAGNOSIS — I10 Essential (primary) hypertension: Secondary | ICD-10-CM | POA: Diagnosis not present

## 2021-09-07 DIAGNOSIS — K219 Gastro-esophageal reflux disease without esophagitis: Secondary | ICD-10-CM | POA: Diagnosis not present

## 2021-10-26 DIAGNOSIS — Z9884 Bariatric surgery status: Secondary | ICD-10-CM | POA: Diagnosis not present

## 2021-12-28 DIAGNOSIS — N760 Acute vaginitis: Secondary | ICD-10-CM | POA: Diagnosis not present

## 2021-12-28 DIAGNOSIS — B9689 Other specified bacterial agents as the cause of diseases classified elsewhere: Secondary | ICD-10-CM | POA: Diagnosis not present

## 2022-01-25 DIAGNOSIS — M5441 Lumbago with sciatica, right side: Secondary | ICD-10-CM | POA: Diagnosis not present

## 2022-01-25 DIAGNOSIS — I119 Hypertensive heart disease without heart failure: Secondary | ICD-10-CM | POA: Diagnosis not present

## 2022-01-25 DIAGNOSIS — J0100 Acute maxillary sinusitis, unspecified: Secondary | ICD-10-CM | POA: Diagnosis not present

## 2022-01-25 DIAGNOSIS — I10 Essential (primary) hypertension: Secondary | ICD-10-CM | POA: Diagnosis not present

## 2022-02-01 DIAGNOSIS — I10 Essential (primary) hypertension: Secondary | ICD-10-CM | POA: Diagnosis not present

## 2022-02-01 DIAGNOSIS — K219 Gastro-esophageal reflux disease without esophagitis: Secondary | ICD-10-CM | POA: Diagnosis not present

## 2022-02-01 DIAGNOSIS — M5441 Lumbago with sciatica, right side: Secondary | ICD-10-CM | POA: Diagnosis not present

## 2022-02-01 DIAGNOSIS — I119 Hypertensive heart disease without heart failure: Secondary | ICD-10-CM | POA: Diagnosis not present

## 2022-03-08 DIAGNOSIS — I10 Essential (primary) hypertension: Secondary | ICD-10-CM | POA: Diagnosis not present

## 2022-03-08 DIAGNOSIS — E559 Vitamin D deficiency, unspecified: Secondary | ICD-10-CM | POA: Diagnosis not present

## 2022-03-08 DIAGNOSIS — Z131 Encounter for screening for diabetes mellitus: Secondary | ICD-10-CM | POA: Diagnosis not present

## 2022-03-08 DIAGNOSIS — Z0000 Encounter for general adult medical examination without abnormal findings: Secondary | ICD-10-CM | POA: Diagnosis not present

## 2022-03-08 DIAGNOSIS — M5441 Lumbago with sciatica, right side: Secondary | ICD-10-CM | POA: Diagnosis not present

## 2022-03-08 DIAGNOSIS — I119 Hypertensive heart disease without heart failure: Secondary | ICD-10-CM | POA: Diagnosis not present

## 2022-03-08 DIAGNOSIS — Z86711 Personal history of pulmonary embolism: Secondary | ICD-10-CM | POA: Diagnosis not present

## 2022-03-08 DIAGNOSIS — K219 Gastro-esophageal reflux disease without esophagitis: Secondary | ICD-10-CM | POA: Diagnosis not present

## 2022-03-17 DIAGNOSIS — B349 Viral infection, unspecified: Secondary | ICD-10-CM | POA: Diagnosis not present

## 2022-04-26 DIAGNOSIS — Z9884 Bariatric surgery status: Secondary | ICD-10-CM | POA: Diagnosis not present

## 2022-05-20 DIAGNOSIS — T1501XA Foreign body in cornea, right eye, initial encounter: Secondary | ICD-10-CM | POA: Diagnosis not present

## 2022-05-31 DIAGNOSIS — K219 Gastro-esophageal reflux disease without esophagitis: Secondary | ICD-10-CM | POA: Diagnosis not present

## 2022-05-31 DIAGNOSIS — J309 Allergic rhinitis, unspecified: Secondary | ICD-10-CM | POA: Diagnosis not present

## 2022-05-31 DIAGNOSIS — I119 Hypertensive heart disease without heart failure: Secondary | ICD-10-CM | POA: Diagnosis not present

## 2022-05-31 DIAGNOSIS — I10 Essential (primary) hypertension: Secondary | ICD-10-CM | POA: Diagnosis not present

## 2022-06-14 DIAGNOSIS — Z1231 Encounter for screening mammogram for malignant neoplasm of breast: Secondary | ICD-10-CM | POA: Diagnosis not present

## 2022-07-26 DIAGNOSIS — I10 Essential (primary) hypertension: Secondary | ICD-10-CM | POA: Diagnosis not present

## 2022-07-26 DIAGNOSIS — I119 Hypertensive heart disease without heart failure: Secondary | ICD-10-CM | POA: Diagnosis not present

## 2022-07-26 DIAGNOSIS — M79671 Pain in right foot: Secondary | ICD-10-CM | POA: Diagnosis not present

## 2022-07-26 DIAGNOSIS — K5909 Other constipation: Secondary | ICD-10-CM | POA: Diagnosis not present

## 2022-08-14 ENCOUNTER — Ambulatory Visit: Payer: BC Managed Care – PPO | Admitting: Podiatry

## 2022-08-14 ENCOUNTER — Encounter: Payer: Self-pay | Admitting: Podiatry

## 2022-08-14 ENCOUNTER — Ambulatory Visit (INDEPENDENT_AMBULATORY_CARE_PROVIDER_SITE_OTHER): Payer: BC Managed Care – PPO

## 2022-08-14 VITALS — BP 127/76

## 2022-08-14 DIAGNOSIS — M722 Plantar fascial fibromatosis: Secondary | ICD-10-CM

## 2022-08-14 MED ORDER — TRIAMCINOLONE ACETONIDE 10 MG/ML IJ SUSP
10.0000 mg | Freq: Once | INTRAMUSCULAR | Status: AC
  Administered 2022-08-14: 10 mg

## 2022-08-14 MED ORDER — DEXAMETHASONE SODIUM PHOSPHATE 120 MG/30ML IJ SOLN
4.0000 mg | Freq: Once | INTRAMUSCULAR | Status: AC
  Administered 2022-08-14: 4 mg via INTRA_ARTICULAR

## 2022-08-14 NOTE — Patient Instructions (Signed)

## 2022-08-14 NOTE — Progress Notes (Signed)
  Subjective:  Patient ID: Ashley Miller, female    DOB: 05-05-1980,   MRN: [id number]  No chief complaint on file.   42 y.o. female presents for concern of bilateral foot pain. Relates for about a month she has been having pain in the bottom of her right heel. Relates pain with first steps in the morning and latera in the day when on her feet for long periods. Relates she has been taking tylenol with minimal relief. Referred here by PCP. Denies any other pedal complaints. Denies n/v/f/c.   Past Medical History:  Diagnosis Date   Hypertension     Objective:  Physical Exam: Vascular: DP/PT pulses 2/4 bilateral. CFT <3 seconds. Normal hair growth on digits. No edema.  Skin. No lacerations or abrasions bilateral feet.  Musculoskeletal: MMT 5/5 bilateral lower extremities in DF, PF, Inversion and Eversion. Deceased ROM in DF of ankle joint. Tender to medial calcaneal tubercle on left. No pain with calcaneal squeeze. Mild pain to palpation of PT tendon. No pain along achilles or arch. No pain with palpation of the left ankle. Collapse of medial arch noted with too many toe sign.  Neurological: Sensation intact to light touch.   Assessment:   1. Plantar fasciitis of left foot      Plan:  Patient was evaluated and treated and all questions answered. Discussed plantar fasciitis with patient.  X-rays reviewed and discussed with patient. No acute fractures or dislocations noted. Mild spurring noted at inferior calcaneus and posterior calcaneus. Pes planus noted with some degenerative changes noted at anterior left ankle.  Reviewed notes from PCP.  Discussed treatment options including, ice, NSAIDS, supportive shoes, bracing, and stretching. Stretching exercises provided to be done on a daily basis.   Patient advised to take the meloxicam that was given previously as needed.  Patient requesting injection today. Procedure note below.   PF brace dispensed.  Follow-up 6 weeks or sooner if any  problems arise. In the meantime, encouraged to call the office with any questions, concerns, change in symptoms.   Procedure:  Discussed etiology, pathology, conservative vs. surgical therapies. At this time a plantar fascial injection was recommended.  The patient agreed and a sterile skin prep was applied.  An injection consisting of  1cc dexamethasone 0.5 cc kenalog and 1cc marcaine mixture was infiltrated at the point of maximal tenderness on the left Heel.  Bandaid applied. The patient tolerated this well and was given instructions for aftercare.    Louann Sjogren, DPM

## 2022-08-23 DIAGNOSIS — I10 Essential (primary) hypertension: Secondary | ICD-10-CM | POA: Diagnosis not present

## 2022-08-23 DIAGNOSIS — K5909 Other constipation: Secondary | ICD-10-CM | POA: Diagnosis not present

## 2022-08-23 DIAGNOSIS — K219 Gastro-esophageal reflux disease without esophagitis: Secondary | ICD-10-CM | POA: Diagnosis not present

## 2022-08-23 DIAGNOSIS — I119 Hypertensive heart disease without heart failure: Secondary | ICD-10-CM | POA: Diagnosis not present

## 2022-08-31 ENCOUNTER — Other Ambulatory Visit: Payer: Self-pay | Admitting: Podiatry

## 2022-08-31 ENCOUNTER — Telehealth: Payer: Self-pay | Admitting: Podiatry

## 2022-08-31 DIAGNOSIS — Z9884 Bariatric surgery status: Secondary | ICD-10-CM | POA: Diagnosis not present

## 2022-08-31 DIAGNOSIS — R635 Abnormal weight gain: Secondary | ICD-10-CM | POA: Diagnosis not present

## 2022-08-31 DIAGNOSIS — M793 Panniculitis, unspecified: Secondary | ICD-10-CM | POA: Diagnosis not present

## 2022-08-31 DIAGNOSIS — Z6841 Body Mass Index (BMI) 40.0 and over, adult: Secondary | ICD-10-CM | POA: Diagnosis not present

## 2022-08-31 MED ORDER — MELOXICAM 15 MG PO TABS: 15.0000 mg | ORAL_TABLET | Freq: Every day | ORAL | 0 refills | Status: DC

## 2022-08-31 NOTE — Telephone Encounter (Signed)
At this point it should be feeling better. I can send in a refill of the meloxicam to help aid with the pain.

## 2022-08-31 NOTE — Telephone Encounter (Signed)
Pt called in stating that her foot feels worse than before the injection. Pt wants to know if that's normal or if she needs a rx to help.  Please advise

## 2022-09-11 DIAGNOSIS — Z23 Encounter for immunization: Secondary | ICD-10-CM | POA: Diagnosis not present

## 2022-09-11 DIAGNOSIS — M793 Panniculitis, unspecified: Secondary | ICD-10-CM | POA: Diagnosis not present

## 2022-09-11 DIAGNOSIS — S91012A Laceration without foreign body, left ankle, initial encounter: Secondary | ICD-10-CM | POA: Diagnosis not present

## 2022-09-11 DIAGNOSIS — L304 Erythema intertrigo: Secondary | ICD-10-CM | POA: Diagnosis not present

## 2022-09-20 DIAGNOSIS — S91012A Laceration without foreign body, left ankle, initial encounter: Secondary | ICD-10-CM | POA: Diagnosis not present

## 2022-09-25 DIAGNOSIS — S91012A Laceration without foreign body, left ankle, initial encounter: Secondary | ICD-10-CM | POA: Diagnosis not present

## 2022-09-26 ENCOUNTER — Ambulatory Visit: Payer: BC Managed Care – PPO | Admitting: Podiatry

## 2022-09-27 DIAGNOSIS — E6609 Other obesity due to excess calories: Secondary | ICD-10-CM | POA: Diagnosis not present

## 2022-09-27 DIAGNOSIS — S91012D Laceration without foreign body, left ankle, subsequent encounter: Secondary | ICD-10-CM | POA: Diagnosis not present

## 2022-09-27 DIAGNOSIS — W458XXD Other foreign body or object entering through skin, subsequent encounter: Secondary | ICD-10-CM | POA: Diagnosis not present

## 2022-09-27 DIAGNOSIS — Z6841 Body Mass Index (BMI) 40.0 and over, adult: Secondary | ICD-10-CM | POA: Diagnosis not present

## 2022-09-27 DIAGNOSIS — S91312D Laceration without foreign body, left foot, subsequent encounter: Secondary | ICD-10-CM | POA: Diagnosis not present

## 2022-09-27 DIAGNOSIS — I10 Essential (primary) hypertension: Secondary | ICD-10-CM | POA: Diagnosis not present

## 2022-09-27 DIAGNOSIS — S91012A Laceration without foreign body, left ankle, initial encounter: Secondary | ICD-10-CM | POA: Diagnosis not present

## 2022-09-27 DIAGNOSIS — I96 Gangrene, not elsewhere classified: Secondary | ICD-10-CM | POA: Diagnosis not present

## 2022-10-04 DIAGNOSIS — X58XXXD Exposure to other specified factors, subsequent encounter: Secondary | ICD-10-CM | POA: Diagnosis not present

## 2022-10-04 DIAGNOSIS — S91312D Laceration without foreign body, left foot, subsequent encounter: Secondary | ICD-10-CM | POA: Diagnosis not present

## 2022-10-04 DIAGNOSIS — I96 Gangrene, not elsewhere classified: Secondary | ICD-10-CM | POA: Diagnosis not present

## 2022-10-04 DIAGNOSIS — Z6841 Body Mass Index (BMI) 40.0 and over, adult: Secondary | ICD-10-CM | POA: Diagnosis not present

## 2022-10-04 DIAGNOSIS — E669 Obesity, unspecified: Secondary | ICD-10-CM | POA: Diagnosis not present

## 2022-10-11 ENCOUNTER — Encounter: Payer: Self-pay | Admitting: Podiatry

## 2022-10-11 ENCOUNTER — Ambulatory Visit: Payer: BC Managed Care – PPO | Admitting: Podiatry

## 2022-10-11 DIAGNOSIS — I96 Gangrene, not elsewhere classified: Secondary | ICD-10-CM | POA: Diagnosis not present

## 2022-10-11 DIAGNOSIS — M722 Plantar fascial fibromatosis: Secondary | ICD-10-CM

## 2022-10-11 DIAGNOSIS — X58XXXD Exposure to other specified factors, subsequent encounter: Secondary | ICD-10-CM | POA: Diagnosis not present

## 2022-10-11 DIAGNOSIS — S91312D Laceration without foreign body, left foot, subsequent encounter: Secondary | ICD-10-CM | POA: Diagnosis not present

## 2022-10-11 DIAGNOSIS — E669 Obesity, unspecified: Secondary | ICD-10-CM | POA: Diagnosis not present

## 2022-10-11 DIAGNOSIS — Z6841 Body Mass Index (BMI) 40.0 and over, adult: Secondary | ICD-10-CM | POA: Diagnosis not present

## 2022-10-11 MED ORDER — TRIAMCINOLONE ACETONIDE 10 MG/ML IJ SUSP
2.5000 mg | Freq: Once | INTRAMUSCULAR | Status: AC
  Administered 2022-10-11: 2.5 mg via INTRA_ARTICULAR

## 2022-10-11 MED ORDER — DEXAMETHASONE SODIUM PHOSPHATE 120 MG/30ML IJ SOLN
4.0000 mg | Freq: Once | INTRAMUSCULAR | Status: AC
  Administered 2022-10-11: 4 mg via INTRA_ARTICULAR

## 2022-10-11 NOTE — Progress Notes (Signed)
  Subjective:  Patient ID: Ashley Miller, female    DOB: 10/04/1980,   MRN: [id number]  Chief Complaint  Patient presents with   Plantar Fasciitis    F/ u appointment-Plantar Fasciitis,right foot,started back hurting on Sat. 7 th    42 y.o. female presents for follow-up of right foot plantar fasciitis. Relates mostly the right hurting today. Does relates an injury to her left and been on crutches and seeing wound care. Relates she has put more weight on the right and on Saturday the pain returned in the right foot. Has been stretching occasionally and wearing brace but brace no longer effective and broken. . Referred here by PCP. Denies any other pedal complaints. Denies n/v/f/c.   Past Medical History:  Diagnosis Date   Hypertension     Objective:  Physical Exam: Vascular: DP/PT pulses 2/4 bilateral. CFT <3 seconds. Normal hair growth on digits. No edema.  Skin. No lacerations or abrasions bilateral feet.  Musculoskeletal: MMT 5/5 bilateral lower extremities in DF, PF, Inversion and Eversion. Deceased ROM in DF of ankle joint. Tender to medial calcaneal tubercle on left. No pain with calcaneal squeeze. Mild pain to palpation of PT tendon. No pain along achilles or arch. No pain with palpation of the left ankle. Collapse of medial arch noted with too many toe sign.  Neurological: Sensation intact to light touch.   Assessment:   1. Plantar fasciitis of right foot       Plan:  Patient was evaluated and treated and all questions answered. Discussed plantar fasciitis with patient.  X-rays reviewed and discussed with patient. No acute fractures or dislocations noted. Mild spurring noted at inferior calcaneus and posterior calcaneus. Pes planus noted with some degenerative changes noted at anterior left ankle.  Reviewed notes from PCP.  Discussed treatment options including, ice, NSAIDS, supportive shoes, bracing, and stretching. Continue meloxicam and stretching.   Patient requesting  injection today. Procedure note below.   PF brace dispensed previous brace broken and no longer funcitioning. .  Follow-up 6 weeks or sooner if any problems arise. In the meantime, encouraged to call the office with any questions, concerns, change in symptoms.   Procedure:  Discussed etiology, pathology, conservative vs. surgical therapies. At this time a plantar fascial injection was recommended.  The patient agreed and a sterile skin prep was applied.  An injection consisting of  1cc dexamethasone 0.5 cc kenalog and 1cc marcaine mixture was infiltrated at the point of maximal tenderness on the left Heel.  Bandaid applied. The patient tolerated this well and was given instructions for aftercare.    Louann Sjogren, DPM

## 2022-10-25 DIAGNOSIS — I96 Gangrene, not elsewhere classified: Secondary | ICD-10-CM | POA: Diagnosis not present

## 2022-10-25 DIAGNOSIS — S91312D Laceration without foreign body, left foot, subsequent encounter: Secondary | ICD-10-CM | POA: Diagnosis not present

## 2022-10-25 DIAGNOSIS — Z6841 Body Mass Index (BMI) 40.0 and over, adult: Secondary | ICD-10-CM | POA: Diagnosis not present

## 2022-10-25 DIAGNOSIS — E669 Obesity, unspecified: Secondary | ICD-10-CM | POA: Diagnosis not present

## 2022-10-25 DIAGNOSIS — X58XXXD Exposure to other specified factors, subsequent encounter: Secondary | ICD-10-CM | POA: Diagnosis not present

## 2022-11-08 DIAGNOSIS — Z131 Encounter for screening for diabetes mellitus: Secondary | ICD-10-CM | POA: Diagnosis not present

## 2022-11-08 DIAGNOSIS — Z86711 Personal history of pulmonary embolism: Secondary | ICD-10-CM | POA: Diagnosis not present

## 2022-11-08 DIAGNOSIS — I10 Essential (primary) hypertension: Secondary | ICD-10-CM | POA: Diagnosis not present

## 2022-11-08 DIAGNOSIS — I119 Hypertensive heart disease without heart failure: Secondary | ICD-10-CM | POA: Diagnosis not present

## 2022-11-08 DIAGNOSIS — K5909 Other constipation: Secondary | ICD-10-CM | POA: Diagnosis not present

## 2022-11-08 DIAGNOSIS — K219 Gastro-esophageal reflux disease without esophagitis: Secondary | ICD-10-CM | POA: Diagnosis not present

## 2022-11-29 ENCOUNTER — Other Ambulatory Visit: Payer: Self-pay | Admitting: Plastic Surgery

## 2022-11-29 DIAGNOSIS — E65 Localized adiposity: Secondary | ICD-10-CM | POA: Diagnosis not present

## 2022-11-29 DIAGNOSIS — Z411 Encounter for cosmetic surgery: Secondary | ICD-10-CM | POA: Diagnosis not present

## 2022-11-29 DIAGNOSIS — L987 Excessive and redundant skin and subcutaneous tissue: Secondary | ICD-10-CM | POA: Diagnosis not present

## 2022-11-29 DIAGNOSIS — M793 Panniculitis, unspecified: Secondary | ICD-10-CM | POA: Diagnosis not present

## 2022-11-30 LAB — SURGICAL PATHOLOGY

## 2023-01-05 DIAGNOSIS — L700 Acne vulgaris: Secondary | ICD-10-CM | POA: Diagnosis not present

## 2023-02-07 DIAGNOSIS — K5909 Other constipation: Secondary | ICD-10-CM | POA: Diagnosis not present

## 2023-02-07 DIAGNOSIS — I119 Hypertensive heart disease without heart failure: Secondary | ICD-10-CM | POA: Diagnosis not present

## 2023-02-07 DIAGNOSIS — K219 Gastro-esophageal reflux disease without esophagitis: Secondary | ICD-10-CM | POA: Diagnosis not present

## 2023-02-07 DIAGNOSIS — I10 Essential (primary) hypertension: Secondary | ICD-10-CM | POA: Diagnosis not present

## 2023-04-11 DIAGNOSIS — E559 Vitamin D deficiency, unspecified: Secondary | ICD-10-CM | POA: Diagnosis not present

## 2023-04-11 DIAGNOSIS — I119 Hypertensive heart disease without heart failure: Secondary | ICD-10-CM | POA: Diagnosis not present

## 2023-04-11 DIAGNOSIS — Z0000 Encounter for general adult medical examination without abnormal findings: Secondary | ICD-10-CM | POA: Diagnosis not present

## 2023-04-11 DIAGNOSIS — K5909 Other constipation: Secondary | ICD-10-CM | POA: Diagnosis not present

## 2023-04-11 DIAGNOSIS — I10 Essential (primary) hypertension: Secondary | ICD-10-CM | POA: Diagnosis not present

## 2023-04-11 DIAGNOSIS — Z131 Encounter for screening for diabetes mellitus: Secondary | ICD-10-CM | POA: Diagnosis not present

## 2023-04-11 DIAGNOSIS — Z86711 Personal history of pulmonary embolism: Secondary | ICD-10-CM | POA: Diagnosis not present

## 2023-04-11 DIAGNOSIS — K219 Gastro-esophageal reflux disease without esophagitis: Secondary | ICD-10-CM | POA: Diagnosis not present

## 2023-05-09 DIAGNOSIS — I119 Hypertensive heart disease without heart failure: Secondary | ICD-10-CM | POA: Diagnosis not present

## 2023-05-09 DIAGNOSIS — I8390 Asymptomatic varicose veins of unspecified lower extremity: Secondary | ICD-10-CM | POA: Diagnosis not present

## 2023-05-09 DIAGNOSIS — I10 Essential (primary) hypertension: Secondary | ICD-10-CM | POA: Diagnosis not present

## 2023-05-09 DIAGNOSIS — E559 Vitamin D deficiency, unspecified: Secondary | ICD-10-CM | POA: Diagnosis not present

## 2023-07-17 DIAGNOSIS — N898 Other specified noninflammatory disorders of vagina: Secondary | ICD-10-CM | POA: Diagnosis not present

## 2023-07-17 DIAGNOSIS — B3731 Acute candidiasis of vulva and vagina: Secondary | ICD-10-CM | POA: Diagnosis not present

## 2023-07-19 DIAGNOSIS — Z1231 Encounter for screening mammogram for malignant neoplasm of breast: Secondary | ICD-10-CM | POA: Diagnosis not present

## 2023-07-25 DIAGNOSIS — Z9889 Other specified postprocedural states: Secondary | ICD-10-CM | POA: Diagnosis not present

## 2023-07-25 DIAGNOSIS — M21061 Valgus deformity, not elsewhere classified, right knee: Secondary | ICD-10-CM | POA: Diagnosis not present

## 2023-07-25 DIAGNOSIS — M1711 Unilateral primary osteoarthritis, right knee: Secondary | ICD-10-CM | POA: Diagnosis not present

## 2023-07-25 DIAGNOSIS — M79604 Pain in right leg: Secondary | ICD-10-CM | POA: Diagnosis not present

## 2023-07-25 DIAGNOSIS — Z9884 Bariatric surgery status: Secondary | ICD-10-CM | POA: Diagnosis not present

## 2023-08-06 DIAGNOSIS — Z01419 Encounter for gynecological examination (general) (routine) without abnormal findings: Secondary | ICD-10-CM | POA: Diagnosis not present

## 2023-08-06 DIAGNOSIS — Z1151 Encounter for screening for human papillomavirus (HPV): Secondary | ICD-10-CM | POA: Diagnosis not present

## 2023-08-15 DIAGNOSIS — I119 Hypertensive heart disease without heart failure: Secondary | ICD-10-CM | POA: Diagnosis not present

## 2023-08-15 DIAGNOSIS — E559 Vitamin D deficiency, unspecified: Secondary | ICD-10-CM | POA: Diagnosis not present

## 2023-08-15 DIAGNOSIS — I10 Essential (primary) hypertension: Secondary | ICD-10-CM | POA: Diagnosis not present

## 2023-08-15 DIAGNOSIS — I8390 Asymptomatic varicose veins of unspecified lower extremity: Secondary | ICD-10-CM | POA: Diagnosis not present

## 2023-08-22 DIAGNOSIS — M1711 Unilateral primary osteoarthritis, right knee: Secondary | ICD-10-CM | POA: Diagnosis not present

## 2023-09-19 DIAGNOSIS — I10 Essential (primary) hypertension: Secondary | ICD-10-CM | POA: Diagnosis not present

## 2023-09-19 DIAGNOSIS — I119 Hypertensive heart disease without heart failure: Secondary | ICD-10-CM | POA: Diagnosis not present

## 2023-09-19 DIAGNOSIS — K5909 Other constipation: Secondary | ICD-10-CM | POA: Diagnosis not present

## 2023-09-19 DIAGNOSIS — E559 Vitamin D deficiency, unspecified: Secondary | ICD-10-CM | POA: Diagnosis not present

## 2023-10-10 ENCOUNTER — Encounter (HOSPITAL_COMMUNITY): Payer: Self-pay | Admitting: *Deleted

## 2023-11-01 DIAGNOSIS — Z9884 Bariatric surgery status: Secondary | ICD-10-CM | POA: Diagnosis not present

## 2023-11-08 ENCOUNTER — Institutional Professional Consult (permissible substitution): Admitting: Nurse Practitioner

## 2023-11-12 ENCOUNTER — Ambulatory Visit: Admitting: Bariatrics

## 2023-11-12 ENCOUNTER — Encounter: Payer: Self-pay | Admitting: Bariatrics

## 2023-11-12 VITALS — BP 134/82 | HR 77 | Temp 97.6°F | Ht 70.0 in | Wt 312.0 lb

## 2023-11-12 DIAGNOSIS — Z6841 Body Mass Index (BMI) 40.0 and over, adult: Secondary | ICD-10-CM

## 2023-11-12 DIAGNOSIS — Z9884 Bariatric surgery status: Secondary | ICD-10-CM

## 2023-11-12 DIAGNOSIS — E65 Localized adiposity: Secondary | ICD-10-CM

## 2023-11-12 NOTE — Progress Notes (Signed)
 Office: [phone]  /  Fax: [phone]   Initial Visit  Ashley Miller was seen in clinic today to evaluate for obesity. She is interested in losing weight to improve overall health and reduce the risk of weight related complications. She presents today to review program treatment options, initial physical assessment, and evaluation.     She was referred by: Specialist  When asked what else they would like to accomplish? She states: Adopt a healthier eating pattern and lifestyle, Improve energy levels and physical activity, Improve existing medical conditions, Improve quality of life, and Improve appearance  When asked how has your weight affected you? She states: Contributed to medical problems, Contributed to orthopedic problems or mobility issues, Having fatigue, and Having poor endurance  Some associated conditions: Hypertension and Prediabetes, states with resolved with bariatric surgery.   Contributing factors: family history of obesity, reduced physical activity, and sedentary job  Weight promoting medications identified: Steroids (inflammatory conditions)  Current nutrition plan: High-protein and Portion control / smart choices  Current level of physical activity: None and Limited due to chronic pain or orthopedic problems  Current or previous pharmacotherapy: GLP-1 and Phentermine (Wegovy and Phentermine in the distant past)  Response to medication: Had side effects so it was discontinued   Past medical history includes:   Past Medical History:  Diagnosis Date   Hypertension      Objective:   BP 134/82   Pulse 77   Temp 97.6 F (36.4 C)   Ht 5' 10 (1.778 m)   Wt (!) 312 lb (141.5 kg)   SpO2 98%   BMI 44.77 kg/m  She was weighed on the bioimpedance scale: Body mass index is 44.77 kg/m.  Peak Weight:410 lbs , Body Fat%:50.7 d%, Visceral Fat Rating:16, Weight trend over the last 12 months: fluctuating.   General:  Alert, oriented and cooperative. Patient  is in no acute distress.  Respiratory: Normal respiratory effort, no problems with respiration noted  Extremities: Normal range of motion.    Mental Status: Normal mood and affect. Normal behavior. Normal judgment and thought content.   DIAGNOSTIC DATA REVIEWED:  BMET    Component Value Date/Time   NA 137 10/07/2019 1138   K 4.1 10/07/2019 1138   CL 99 10/07/2019 1138   CO2 31 10/07/2019 1138   GLUCOSE 91 10/07/2019 1138   BUN 11 10/07/2019 1138   CREATININE 0.90 10/07/2019 1138   CALCIUM  9.5 10/07/2019 1138   GFRNONAA >60 10/07/2019 1138   GFRAA >60 10/07/2019 1138   No results found for: HGBA1C No results found for: INSULIN CBC    Component Value Date/Time   WBC 7.8 10/07/2019 1138   WBC 8.0 04/20/2017 0506   RBC 4.57 10/07/2019 1138   HGB 13.5 10/07/2019 1138   HCT 41.3 10/07/2019 1138   PLT 317 10/07/2019 1138   MCV 90.4 10/07/2019 1138   MCH 29.5 10/07/2019 1138   MCHC 32.7 10/07/2019 1138   RDW 12.1 10/07/2019 1138   Iron/TIBC/Ferritin/ %Sat    Component Value Date/Time   IRON 53 08/24/2017 0840   TIBC 365 08/24/2017 0840   FERRITIN 26 08/24/2017 0840   IRONPCTSAT 15 (L) 08/24/2017 0840   Lipid Panel  No results found for: CHOL, TRIG, HDL, CHOLHDL, VLDL, LDLCALC, LDLDIRECT Hepatic Function Panel     Component Value Date/Time   PROT 7.5 10/07/2019 1138   ALBUMIN 4.0 10/07/2019 1138   AST 12 (L) 10/07/2019 1138   ALT 10 10/07/2019 1138   ALKPHOS 61  10/07/2019 1138   BILITOT 0.8 10/07/2019 1138      Component Value Date/Time   TSH 1.384 04/19/2017 0453     Assessment and Plan:   Visceral Obesity.   She has a visceral fat rating of 16 per the bio-impedence scale.   Plan: The goal is a visceral fat rating of 13 or below.  Will work on the plan and increase exercise/begin exercise.  IWill minimize all carbohydrates ( sweets and starches ).    History of Bariatric Surgery ( gastric bypass )  She states that she met with one  of the surgeons and was referred to our program.  She does not want to have another bariatric procedure at this time.   Dr/Facility/State:  [facility] Year: 2019 to 2020 Complications: none Highest Weight: 410 lbs  Lowest Weight: 265 Lbs Taking vitamins: taking MV and D and B-12  Hx of deficiencies: none  Hx of iron infusions: none, but took supplemental iron.  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   Plan Counseling You may need to eat 3 meals and 2 snacks, or 5 small meals each day in order to reach your protein and calorie goals.  Allow at least 15 minutes for each meal so that you can eat mindfully. Listen to your body so that you do not overeat. For most people, your sleeve or pouch will comfortably hold 4-6 ounces. Eat foods from all food groups. This includes fruits and vegetables, grains, dairy, and meat and other proteins. Include a protein-rich food at every meal and snack, and eat the protein food first.  You should be taking a Bariatric Multivitamin as well as calcium .     Morbid Obesity: Current BMI 44.77    Obesity Treatment / Action Plan:  Patient will work on garnering support from family and friends to begin weight loss journey. Will work on eliminating or reducing the presence of highly palatable, calorie dense foods in the home. Will complete provided nutritional and psychosocial assessment questionnaire before the next appointment. Will be scheduled for indirect calorimetry to determine resting energy expenditure in a fasting state.  This will allow us  to create a reduced calorie, high-protein meal plan to promote loss of fat mass while preserving muscle mass. Counseled on the health benefits of losing 5%-15% of total body weight. Was counseled on nutritional approaches to weight loss and benefits of reducing processed foods and consuming plant-based foods and high quality protein as part of nutritional weight  management. Was counseled on pharmacotherapy and role as an adjunct in weight management.   Obesity Education Performed Today:  She was weighed on the bioimpedance scale and results were discussed and documented in the synopsis.  We discussed obesity as a disease and the importance of a more detailed evaluation of all the factors contributing to the disease.  We discussed the importance of long term lifestyle changes which include nutrition, exercise and behavioral modifications as well as the importance of customizing this to her specific health and social needs.  We discussed the benefits of reaching a healthier weight to alleviate the symptoms of existing conditions and reduce the risks of the biomechanical, metabolic and psychological effects of obesity.  Discussed New Patient/Late Arrival, and Cancellation Policies. Patient voiced understanding and allowed to ask questions.   Clema D Hofland appears to be in the action stage of change and states they are ready to start intensive lifestyle modifications and behavioral modifications.  30  minutes was spent today on this visit including the above counseling, pre-visit chart review, and post-visit documentation.  Reviewed by clinician on day of visit: allergies, medications, problem list, medical history, surgical history, family history, social history, and previous encounter notes.    Nieshia Larmon A. Delores CORDOBAO.

## 2023-11-20 ENCOUNTER — Encounter: Payer: Self-pay | Admitting: Bariatrics

## 2023-11-20 ENCOUNTER — Ambulatory Visit: Admitting: Bariatrics

## 2023-11-20 VITALS — BP 114/76 | HR 78 | Temp 97.0°F | Ht 70.0 in | Wt 309.0 lb

## 2023-11-20 DIAGNOSIS — E559 Vitamin D deficiency, unspecified: Secondary | ICD-10-CM

## 2023-11-20 DIAGNOSIS — Z6841 Body Mass Index (BMI) 40.0 and over, adult: Secondary | ICD-10-CM

## 2023-11-20 DIAGNOSIS — Z1331 Encounter for screening for depression: Secondary | ICD-10-CM | POA: Diagnosis not present

## 2023-11-20 DIAGNOSIS — E66813 Obesity, class 3: Secondary | ICD-10-CM

## 2023-11-20 DIAGNOSIS — R5383 Other fatigue: Secondary | ICD-10-CM | POA: Diagnosis not present

## 2023-11-20 DIAGNOSIS — Z9884 Bariatric surgery status: Secondary | ICD-10-CM | POA: Diagnosis not present

## 2023-11-20 DIAGNOSIS — R0602 Shortness of breath: Secondary | ICD-10-CM

## 2023-11-20 DIAGNOSIS — Z0000 Encounter for general adult medical examination without abnormal findings: Secondary | ICD-10-CM

## 2023-11-20 NOTE — Progress Notes (Signed)
 At a Glance:  Vitals Temp: (!) 97 F (36.1 C) BP: 114/76 Pulse Rate: 78 SpO2: 98 %   Anthropometric Measurements Height: 5' 10 (1.778 m) Weight: (!) 309 lb (140.2 kg) BMI (Calculated): 44.34 Starting Weight: 309lb Peak Weight: 410lb Waist Measurement : 48 inches   Body Composition  Body Fat %: 50 % Fat Mass (lbs): 154.8 lbs Muscle Mass (lbs): 146.8 lbs Total Body Water (lbs): 102 lbs Visceral Fat Rating : 16   Other Clinical Data RMR: 2189 Fasting: yes Labs: yes Today's Visit #: 1 Starting Date: 11/20/23    EKG: Normal sinus rhythm, rate 68.  Indirect Calorimeter:   Resting Metabolic Rate ( RMR):  RMR (actual): 2189 kcal RMR (calculated): 2234 kcal The calculated basal metabolic rate is 7765 kcal thus her basal metabolic rate is worse than expected.  Plan:   Indirect calorimeter completed, interpreted and reviewed with patient today and allowed to ask questions.  Discussed the implications for the chosen plan and exercise based on the RMR reading.  Will consider repeating the RMR in the future based on weight loss.    Chief Complaint:  Obesity   Subjective:  Ashley Miller (MR# [id number]) is a 43 y.o. female who presents for evaluation and treatment of obesity and related comorbidities.   Ashley Miller is currently in the action stage of change and ready to dedicate time achieving and maintaining a healthier weight. Ashley Miller is interested in becoming our patient and working on intensive lifestyle modifications including (but not limited to) diet and exercise for weight loss.  Ashley Miller has been struggling with her weight. She has been unsuccessful in either losing weight, maintaining weight loss, or reaching her healthy weight goal.  Ashley Miller's habits were reviewed today and are as follows: Her family eats meals together, she thinks her family will eat healthier with her, she has been heavy most of her life, she is a picky eater and doesn't like to eat healthier  foods, she has significant food cravings issues, and she skips meals frequently.  Current or previous pharmacotherapy:  GLP-1 and Phentermine [facility] and Phentermine in the distant past)   Response to medication: Had side effects so it was discontinued  Other Fatigue Ashley Miller denies daytime somnolence and admits to waking up still tired.  She generally gets 7 hours of sleep per night, and states that she has generally restful sleep. Snoring is present. Apneic episodes are present. Epworth Sleepiness Score is 4.   Shortness of Breath Ashley Miller notes increasing shortness of breath with exercising and seems to be worsening over time with weight gain. She notes getting out of breath sooner with activity than she used to. This has not gotten worse recently. Ashley Miller denies shortness of breath at rest or orthopnea.  Depression Screen Ashley Miller's Food and Mood (modified PHQ-9) score was 4. <5 no depression     11/20/2023    7:17 AM  Depression screen PHQ 2/9  Decreased Interest 0  Down, Depressed, Hopeless 0  PHQ - 2 Score 0  Altered sleeping 1  Tired, decreased energy 1  Change in appetite 2  Feeling bad or failure about yourself  0  Trouble concentrating 0  Moving slowly or fidgety/restless 0  Suicidal thoughts 0  PHQ-9 Score 4  Difficult doing work/chores Not difficult at all     Assessment and Plan:   Other Fatigue Ashley Miller does feel that her weight is causing her energy to be lower than it should be. Fatigue may be related to obesity, depression  or many other causes. Labs will be ordered, and in the meanwhile, Ashley Miller will focus on self care including making healthy food choices, increasing physical activity and focusing on stress reduction.  Shortness of Breath Ashley Miller does not feel that she gets out of breath more easily that she used to when she exercises. Ashley Miller's shortness of breath appears to be obesity related and exercise induced. She has agreed to work on weight loss and gradually  increase exercise to treat her exercise induced shortness of breath. Will continue to monitor closely.  Health Maintenance:   Obesity   Plan: Will do EKG, indirect calorimetry, and labs.     Vitamin D Deficiency She is at risk for vitamin D deficiency due to obesity.  She is on Ronal Dines liquid vitamins.  No results found for: VD25OH  Plan: Will check for vitamin D deficiency.   History of Bariatric Surgery (laparoscopic sleeve gastrectomy)   Dr/Facility/State: Darryle Law, Dr. 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  Allow at least 15 minutes for each meal so that you can eat mindfully. Listen to your body so that you do not overeat. For most people, your sleeve or pouch will comfortably hold 4-6 ounces. Eat foods from all food groups. This includes fruits and vegetables, grains, dairy, and meat and other proteins. Include a protein-rich food at every meal and snack, and eat the protein food first.  You should be taking a Bariatric Multivitamin as well as calcium .    Previous labs reviewed today: none, no records found.   Labs done today CMP, Lipids, Insulin, HgbA1c, Vit D, Thyroid  Panel, and CBC   Morbid Obesity: BMI (Calculated): 44.34   Ashley Miller is currently in the action stage of change and her goal is to begin weight loss efforts. I recommend Ashley Miller begin the structured treatment plan as follows:  She has agreed to Category 3 Plan  Exercise goals: All adults should avoid inactivity. Some activity is better than none, and adults who participate in any amount of physical activity, gain some health benefits.  Behavioral modification strategies:increasing lean protein intake, increasing vegetables,  increase H2O intake, increase high fiber foods, no skipping meals, meal planning and cooking strategies, keeping healthy foods in the home, better snacking choices, avoiding temptations, and planning for success  She was informed of the importance of frequent follow-up visits to maximize her success with intensive lifestyle modifications for her multiple health conditions. She was informed we would discuss her lab results at her next visit unless there is a critical issue that needs to be addressed sooner. Ashley Miller agreed to keep her next visit at the agreed upon time to discuss these results.  Objective:  General: Cooperative, alert, well developed, in no acute distress. HEENT: Conjunctivae and lids unremarkable. Cardiovascular: Regular rhythm.  Lungs: Normal work of breathing. Neurologic: No focal deficits.   Lab Results  Component Value Date   CREATININE 0.90 10/07/2019   BUN 11 10/07/2019   NA 137 10/07/2019   K 4.1 10/07/2019   CL 99 10/07/2019   CO2 31 10/07/2019   Lab Results  Component Value Date   ALT 10 10/07/2019   AST 12 (L) 10/07/2019   ALKPHOS 61 10/07/2019  BILITOT 0.8 10/07/2019   No results found for: HGBA1C No results found for: INSULIN Lab Results  Component Value Date   TSH 1.384 04/19/2017   No results found for: CHOL, HDL, LDLCALC, LDLDIRECT, TRIG, CHOLHDL Lab Results  Component Value Date   WBC 7.8 10/07/2019   HGB 13.5 10/07/2019   HCT 41.3 10/07/2019   MCV 90.4 10/07/2019   PLT 317 10/07/2019   Lab Results  Component Value Date   IRON 53 08/24/2017   TIBC 365 08/24/2017   FERRITIN 26 08/24/2017    Attestation Statements:  Applicable history such as the following:  allergies, medications, problem list, medical history, surgical history, family history, social history, and previous encounter notes reviewed by clinician on day of visit:  Time spent on visit in care of the patient today including the items listed below was 40  minutes.    20 minutes were spent talking about the history, 20 minutes for face to face counseling implementing the plan, discussing the specifics of how to arrange meals, meal planning, water intake.   I spent face to face time discussing his/her plan, including breakfast, additional breakfast options, lunch, and dinner options, grocery list, and snacks.  I reviewed her indirect calorimetry. I discussed the implications for the diet plan.    Discussed the bio-impedence test (fat %, muscle mass, and water weight) and allowed the patient to ask questions.   Discussed the following information sheets: Category 3, Grocery List, 100 Calorie Snacks, 200 Calorie Snacks, and Protein Shake Sheet.  I additionally spent time documenting, reviewing, and checking the codes before submitting.   This may have been prepared with the assistance of Engineer, civil (consulting).  Occasional wrong-word or sound-a-like substitutions may have occurred due to the inherent limitations of voice recognition software.    Clayborne Daring, DO

## 2023-11-21 ENCOUNTER — Encounter: Payer: Self-pay | Admitting: Bariatrics

## 2023-11-21 DIAGNOSIS — E559 Vitamin D deficiency, unspecified: Secondary | ICD-10-CM | POA: Insufficient documentation

## 2023-11-21 LAB — LIPID PANEL WITH LDL/HDL RATIO
Cholesterol, Total: 172 mg/dL (ref 100–199)
HDL: 61 mg/dL (ref >39)
LDL Chol Calc (NIH): 97 mg/dL (ref 0–99)
LDL/HDL Ratio: 1.6 ratio (ref 0.0–3.2)
Triglycerides: 72 mg/dL (ref 0–149)
VLDL Cholesterol Cal: 14 mg/dL (ref 5–40)

## 2023-11-21 LAB — CBC WITH DIFFERENTIAL/PLATELET
Basophils Absolute: 0.1 x10E3/uL (ref 0.0–0.2)
Basos: 1 %
EOS (ABSOLUTE): 0.1 x10E3/uL (ref 0.0–0.4)
Eos: 1 %
Hematocrit: 38.9 % (ref 34.0–46.6)
Hemoglobin: 12.5 g/dL (ref 11.1–15.9)
Immature Grans (Abs): 0 x10E3/uL (ref 0.0–0.1)
Immature Granulocytes: 0 %
Lymphocytes Absolute: 2.4 x10E3/uL (ref 0.7–3.1)
Lymphs: 27 %
MCH: 29 pg (ref 26.6–33.0)
MCHC: 32.1 g/dL (ref 31.5–35.7)
MCV: 90 fL (ref 79–97)
Monocytes Absolute: 0.7 x10E3/uL (ref 0.1–0.9)
Monocytes: 8 %
Neutrophils Absolute: 5.6 x10E3/uL (ref 1.4–7.0)
Neutrophils: 63 %
Platelets: 308 x10E3/uL (ref 150–450)
RBC: 4.31 x10E6/uL (ref 3.77–5.28)
RDW: 11.9 % (ref 11.7–15.4)
WBC: 8.8 x10E3/uL (ref 3.4–10.8)

## 2023-11-21 LAB — COMPREHENSIVE METABOLIC PANEL WITH GFR
ALT: 15 IU/L (ref 0–32)
AST: 16 IU/L (ref 0–40)
Albumin: 4.1 g/dL (ref 3.9–4.9)
Alkaline Phosphatase: 93 IU/L (ref 41–116)
BUN/Creatinine Ratio: 16 (ref 9–23)
BUN: 13 mg/dL (ref 6–24)
Bilirubin Total: 0.6 mg/dL (ref 0.0–1.2)
CO2: 25 mmol/L (ref 20–29)
Calcium: 9.3 mg/dL (ref 8.7–10.2)
Chloride: 96 mmol/L (ref 96–106)
Creatinine, Ser: 0.79 mg/dL (ref 0.57–1.00)
Globulin, Total: 3.2 g/dL (ref 1.5–4.5)
Glucose: 78 mg/dL (ref 70–99)
Potassium: 4.2 mmol/L (ref 3.5–5.2)
Sodium: 135 mmol/L (ref 134–144)
Total Protein: 7.3 g/dL (ref 6.0–8.5)
eGFR: 94 mL/min/1.73 (ref >59)

## 2023-11-21 LAB — TSH+T4F+T3FREE
Free T4: 1.32 ng/dL (ref 0.82–1.77)
T3, Free: 3.2 pg/mL (ref 2.0–4.4)
TSH: 1.89 u[IU]/mL (ref 0.450–4.500)

## 2023-11-21 LAB — HEMOGLOBIN A1C
Est. average glucose Bld gHb Est-mCnc: 105 mg/dL
Hgb A1c MFr Bld: 5.3 % (ref 4.8–5.6)

## 2023-11-21 LAB — VITAMIN D 25 HYDROXY (VIT D DEFICIENCY, FRACTURES): Vit D, 25-Hydroxy: 24.5 ng/mL — ABNORMAL LOW (ref 30.0–100.0)

## 2023-11-21 LAB — INSULIN, RANDOM: INSULIN: 18.2 u[IU]/mL (ref 2.6–24.9)

## 2023-12-04 ENCOUNTER — Encounter: Payer: Self-pay | Admitting: Bariatrics

## 2023-12-04 ENCOUNTER — Ambulatory Visit: Admitting: Bariatrics

## 2023-12-04 VITALS — BP 133/87 | HR 74 | Temp 98.0°F | Ht 70.0 in | Wt 312.0 lb

## 2023-12-04 DIAGNOSIS — E88819 Insulin resistance, unspecified: Secondary | ICD-10-CM | POA: Diagnosis not present

## 2023-12-04 DIAGNOSIS — E559 Vitamin D deficiency, unspecified: Secondary | ICD-10-CM

## 2023-12-04 DIAGNOSIS — Z6841 Body Mass Index (BMI) 40.0 and over, adult: Secondary | ICD-10-CM

## 2023-12-04 DIAGNOSIS — E66813 Obesity, class 3: Secondary | ICD-10-CM

## 2023-12-04 MED ORDER — VITAMIN D (ERGOCALCIFEROL) 1.25 MG (50000 UNIT) PO CAPS: 50000.0000 [IU] | ORAL_CAPSULE | ORAL | 0 refills | Status: DC

## 2023-12-04 NOTE — Progress Notes (Signed)
 First follow-up after initial visit.        WEIGHT SUMMARY AND BIOMETRICS  Weight Lost Since Last Visit: 0  Weight Gained Since Last Visit: 3lb   Vitals Temp: 98 F (36.7 C) BP: 133/87 Pulse Rate: 74 SpO2: 99 %   Anthropometric Measurements Height: 5' 10 (1.778 m) Weight: (!) 312 lb (141.5 kg) BMI (Calculated): 44.77 Weight at Last Visit: 309lb Weight Lost Since Last Visit: 0 Weight Gained Since Last Visit: 3lb Starting Weight: 309lb Total Weight Loss (lbs): 0 lb (0 kg) Peak Weight: 410lb   Body Composition  Body Fat %: 50.8 % Fat Mass (lbs): 158.8 lbs Muscle Mass (lbs): 146 lbs Total Body Water (lbs): 104.6 lbs Visceral Fat Rating : 16   Other Clinical Data Fasting: no Labs: no Today's Visit #: 2 Starting Date: 11/20/23    OBESITY Ashley Miller is here to discuss her progress with her obesity treatment plan along with follow-up of her obesity related diagnoses.    Nutrition Plan: the Category 3 plan - 50% adherence.  Current exercise: walking  Interim History:  She is up 3 lbs since her last visit.  Not eating all of the food on the plan., Protein intake is as prescribed, Not journaling consistently., and Water intake is inadequate.  Initial positives regarding the dietary plan: She enjoyed the [employer]. She has been walking some.  Initial challenges regarding  the dietary plan: She has not been a breakfast eater and has found it hard find foods that she likes for breakfast.   Pharmacotherapy: Ashley Miller is not on antiobesity medication.  She has been on Wegovy in the past and did well with the medicine except for some nausea and vomiting at the highest dose.  Hunger is moderately controlled.  Cravings are moderately controlled.  Assessment/Plan:   Vitamin D Deficiency Vitamin D is not at goal of 50.  Most recent vitamin D level was  24.5. She is not on any vitamin D supplementation.  Lab Results  Component Value Date   VD25OH 24.5 (L) 11/20/2023    Plan: Refill prescription vitamin D 50,000 IU weekly.   Insulin Resistance Ashley Miller has had elevated fasting insulin readings. Goal is HgbA1c < 5.7, fasting insulin at l0 or less, and preferably at 5.  She denies excessive polyphagia. Medication(s):  Lab Results  Component Value Date   HGBA1C 5.3 11/20/2023   Lab Results  Component Value Date   INSULIN 18.2 11/20/2023    Plan Medication(s): No medications at this time but we discussed both GLP-1's and metformin.  Information sheet was given on both GLP-1's and metformin.  She denies any absolute or relative contraindications. Will work on the agreed upon plan. Will minimize refined carbohydrates ( sweets and starches), and focus more on complex carbohydrates.  Increase the micronutrients found in leafy greens, which include magnesium, polyphenols, and vitamin C which have been postulated to help with insulin sensitivity. Minimize fast food and cook more meals at home.  Increase fiber to 25 to 30 grams daily.  Information sheet on  Insulin Resistance and Prediabetes.   Will take her lunch for work.  Handouts: Eating Out, Travel.    Labs reviewed today from last visit (CMP, Lipids, HgbA1c, insulin, vitamin D, CBC).    Morbid Obesity: Current BMI BMI (Calculated): 44.77   Pharmacotherapy Plan No antiobesity medications at this time but may consider either a GLP-1 or metformin in the future.  Ashley Miller is currently in the action stage of change. As such, her  goal is to continue with weight loss efforts.  She has agreed to the Category 3 plan.  Exercise goals: All adults should avoid inactivity. Some physical activity is better than none, and adults who participate in any amount of physical activity gain some health benefits. She will be walking more and will go to the gym.  Behavioral modification  strategies: increasing lean protein intake, no meal skipping, decrease eating out, meal planning , increase water intake, better snacking choices, planning for success, increasing vegetables, increasing fiber rich foods, avoiding temptations, and keep healthy foods in the home.  Suni has agreed to follow-up with our clinic in 2 weeks with Corean Scala FNP to discuss exercise options.  I will see her back in 4 weeks..     Objective:   VITALS: Per patient if applicable, see vitals. GENERAL: Alert and in no acute distress. CARDIOPULMONARY: No increased WOB. Speaking in clear sentences.  PSYCH: Pleasant and cooperative. Speech normal rate and rhythm. Affect is appropriate. Insight and judgement are appropriate. Attention is focused, linear, and appropriate.  NEURO: Oriented as arrived to appointment on time with no prompting.   Attestation Statements:   This was prepared with the assistance of Engineer, Civil (consulting).  Occasional wrong-word or sound-a-like substitutions may have occurred due to the inherent limitations of voice recognition software.   Clayborne Daring, DO

## 2023-12-20 ENCOUNTER — Ambulatory Visit: Admitting: Nurse Practitioner

## 2023-12-20 ENCOUNTER — Encounter: Payer: Self-pay | Admitting: Nurse Practitioner

## 2023-12-20 VITALS — BP 126/84 | HR 71 | Temp 97.9°F | Ht 70.0 in | Wt 313.0 lb

## 2023-12-20 DIAGNOSIS — E66813 Obesity, class 3: Secondary | ICD-10-CM | POA: Diagnosis not present

## 2023-12-20 DIAGNOSIS — E88819 Insulin resistance, unspecified: Secondary | ICD-10-CM

## 2023-12-20 DIAGNOSIS — Z6841 Body Mass Index (BMI) 40.0 and over, adult: Secondary | ICD-10-CM

## 2023-12-20 NOTE — Progress Notes (Signed)
 Office: [phone]  /  Fax: [phone]  WEIGHT SUMMARY AND BIOMETRICS  Weight Lost Since Last Visit: 0lb  Weight Gained Since Last Visit: 1lb   Vitals Temp: 97.9 F (36.6 C) BP: 126/84 Pulse Rate: 71 SpO2: 98 %   Anthropometric Measurements Height: 5' 10 (1.778 m) Weight: (!) 313 lb (142 kg) BMI (Calculated): 44.91 Weight at Last Visit: 312lb Weight Lost Since Last Visit: 0lb Weight Gained Since Last Visit: 1lb Starting Weight: 309lb Total Weight Loss (lbs): 0 lb (0 kg)   Body Composition  Body Fat %: 51 % Fat Mass (lbs): 159.8 lbs Muscle Mass (lbs): 145.8 lbs Total Body Water (lbs): 106.6 lbs Visceral Fat Rating : 16   Other Clinical Data Fasting: Yes Labs: No Today's Visit #: 3 Starting Date: 11/20/23     HPI  Chief Complaint: OBESITY  Antwan is here to discuss her progress with her obesity treatment plan. She is on the the Category 3 Plan and states she is following her eating plan approximately 65-70 % of the time. She states she is exercising 45 minutes 5 days per week.   Interval History:  Since last office visit she has gained 1 pound.  She works 2 jobs-9am-6pm and part time 7pm-11:30pm.  She is going to the gym in the am before going to work.  She will drink a protein shake before exercising-drinks 1/2 premier protein shake prior to exercising and the other 1/2 after her work out.  She is walking on the treadmill 3 mph for 15-20 mins and then she is doing resistance training on the circuit at [employer].  She then will drink coffee with cream.  She eats lunch between 11:30-noon.  She eats a turkey sandwich with baked chips with water.  She snacks on cheese, salami, pear, banana or popcorn. She eats dinner around 9:30 pm.  She eats at [employer]-chicken strips with celery or carrots and half and half tea.    Pharmacotherapy for weight loss: She is not currently taking medications  for medical weight loss.     Previous pharmacotherapy for  medical weight loss:  Tzhncb  Insulin Resistance Last fasting insulin was 18.2. A1c was 5.3. Polyphagia:Yes Medication(s): none-information given on Meformin after her last visit.   Lab Results  Component Value Date   HGBA1C 5.3 11/20/2023   Lab Results  Component Value Date   INSULIN 18.2 11/20/2023      PHYSICAL EXAM:  Blood pressure 126/84, pulse 71, temperature 97.9 F (36.6 C), height 5' 10 (1.778 m), weight (!) 313 lb (142 kg), SpO2 98%. Body mass index is 44.91 kg/m.  General: She is overweight, cooperative, alert, well developed, and in no acute distress. PSYCH: Has normal mood, affect and thought process.   Extremities: No edema.  Neurologic: No gross sensory or motor deficits. No tremors or fasciculations noted.    DIAGNOSTIC DATA REVIEWED:  BMET    Component Value Date/Time   NA 135 11/20/2023 0833   K 4.2 11/20/2023 0833   CL 96 11/20/2023 0833   CO2 25 11/20/2023 0833   GLUCOSE 78 11/20/2023 0833   GLUCOSE 91 10/07/2019 1138   BUN 13 11/20/2023 0833   CREATININE 0.79 11/20/2023 0833   CREATININE 0.90 10/07/2019 1138   CALCIUM  9.3 11/20/2023 0833   GFRNONAA >60 10/07/2019 1138   GFRAA >60 10/07/2019 1138   Lab Results  Component Value Date   HGBA1C 5.3 11/20/2023   Lab Results  Component Value Date   INSULIN 18.2 11/20/2023  Lab Results  Component Value Date   TSH 1.890 11/20/2023   CBC    Component Value Date/Time   WBC 8.8 11/20/2023 0833   WBC 7.8 10/07/2019 1138   WBC 8.0 04/20/2017 0506   RBC 4.31 11/20/2023 0833   RBC 4.57 10/07/2019 1138   HGB 12.5 11/20/2023 0833   HCT 38.9 11/20/2023 0833   PLT 308 11/20/2023 0833   MCV 90 11/20/2023 0833   MCH 29.0 11/20/2023 0833   MCH 29.5 10/07/2019 1138   MCHC 32.1 11/20/2023 0833   MCHC 32.7 10/07/2019 1138   RDW 11.9 11/20/2023 0833   Iron Studies    Component Value Date/Time   IRON 53 08/24/2017 0840   TIBC 365 08/24/2017 0840   FERRITIN 26 08/24/2017 0840   IRONPCTSAT  15 (L) 08/24/2017 0840   Lipid Panel     Component Value Date/Time   CHOL 172 11/20/2023 0833   TRIG 72 11/20/2023 0833   HDL 61 11/20/2023 0833   LDLCALC 97 11/20/2023 0833   Hepatic Function Panel     Component Value Date/Time   PROT 7.3 11/20/2023 0833   ALBUMIN 4.1 11/20/2023 0833   AST 16 11/20/2023 0833   AST 12 (L) 10/07/2019 1138   ALT 15 11/20/2023 0833   ALT 10 10/07/2019 1138   ALKPHOS 93 11/20/2023 0833   BILITOT 0.6 11/20/2023 0833   BILITOT 0.8 10/07/2019 1138      Component Value Date/Time   TSH 1.890 11/20/2023 0833   Nutritional Lab Results  Component Value Date   VD25OH 24.5 (L) 11/20/2023     ASSESSMENT AND PLAN  TREATMENT PLAN FOR OBESITY:  Recommended Dietary Goals  Nicolasa is currently in the action stage of change. As such, her goal is to continue weight management plan. She has agreed to keeping a food journal and adhering to recommended goals of 1600 calories and 90+ grams of protein. I don't think she is meeting her calories or protein goals.  Will review her macros at her next visit.  I've suggested to eat prior to exercising in the am and then drinking her protein shake after her work outs.  She would benefit from eating earlier then 9:30 pm.  Stop sugary drinks.    App recommended [employer] Intervention  We discussed the following Behavioral Modification Strategies today: increasing lean protein intake to established goals, decreasing simple carbohydrates , increasing vegetables, increasing fiber rich foods, increasing water intake , reading food labels , keeping healthy foods at home, planning for success, better snacking choices, continue to work on maintaining a reduced calorie state, getting the recommended amount of protein, incorporating whole foods, making healthy choices, staying well hydrated and practicing mindfulness when eating., and increase protein intake, fibrous foods (25 grams per day for women, 30 grams for men)  and water to improve satiety and decrease hunger signals. .  Additional resources provided today: NA  Recommended Physical Activity Goals  Zoei has been advised to work up to 150 minutes of moderate intensity aerobic activity a week and strengthening exercises 2-3 times per week for cardiovascular health, weight loss maintenance and preservation of muscle mass.   She has agreed to Think about enjoyable ways to increase daily physical activity and overcoming barriers to exercise, Increase physical activity in their day and reduce sedentary time (increase NEAT)., Continue to gradually increase the amount and intensity of exercise routine, Increase volume of physical activity to a goal of 240 minutes a week, and Combine aerobic and  strengthening exercises for efficiency and improved cardiometabolic health.   Pharmacotherapy To consider options in the future  ASSOCIATED CONDITIONS ADDRESSED TODAY  Action/Plan  Insulin resistance Laquinta will continue to work on weight loss, exercise, and decreasing simple carbohydrates to help decrease the risk of diabetes. Erykah agreed to follow-up with us  as directed to closely monitor her progress.   She is not interested in starting Metformin at this time.  Will consider in the future.    Class 3 severe obesity due to excess calories with serious comorbidity and body mass index (BMI) of 40.0 to 44.9 in adult [facility])         Return in about 2 weeks (around 01/03/2024).SABRA She was informed of the importance of frequent follow up visits to maximize her success with intensive lifestyle modifications for her multiple health conditions.   ATTESTASTION STATEMENTS:  Reviewed by clinician on day of visit: allergies, medications, problem list, medical history, surgical history, family history, social history, and previous encounter notes.   I personally spent a total of 35 minutes in the care of the patient today including preparing to see the patient,  getting/reviewing separately obtained history, performing a medically appropriate exam/evaluation, counseling and educating, and documenting clinical information in the EHR.    Corean SAUNDERS. Lilac Hoff FNP-C

## 2024-01-07 ENCOUNTER — Ambulatory Visit: Admitting: Bariatrics

## 2024-01-07 ENCOUNTER — Encounter: Payer: Self-pay | Admitting: Bariatrics

## 2024-01-07 VITALS — BP 137/84 | HR 80 | Ht 70.0 in | Wt 313.0 lb

## 2024-01-07 DIAGNOSIS — E88819 Insulin resistance, unspecified: Secondary | ICD-10-CM | POA: Diagnosis not present

## 2024-01-07 DIAGNOSIS — Z6841 Body Mass Index (BMI) 40.0 and over, adult: Secondary | ICD-10-CM

## 2024-01-07 DIAGNOSIS — E66813 Obesity, class 3: Secondary | ICD-10-CM

## 2024-01-07 DIAGNOSIS — E559 Vitamin D deficiency, unspecified: Secondary | ICD-10-CM | POA: Diagnosis not present

## 2024-01-07 MED ORDER — ZEPBOUND 2.5 MG/0.5ML ~~LOC~~ SOAJ: 2.5000 mg | SUBCUTANEOUS | 0 refills | Status: DC

## 2024-01-07 MED ORDER — ONDANSETRON HCL 4 MG PO TABS: 4.0000 mg | ORAL_TABLET | Freq: Three times a day (TID) | ORAL | 0 refills | Status: AC | PRN

## 2024-01-07 MED ORDER — VITAMIN D (ERGOCALCIFEROL) 1.25 MG (50000 UNIT) PO CAPS: 50000.0000 [IU] | ORAL_CAPSULE | ORAL | 0 refills | Status: DC

## 2024-01-07 NOTE — Progress Notes (Signed)
 WEIGHT SUMMARY AND BIOMETRICS  Weight Lost Since Last Visit: 0  Weight Gained Since Last Visit: 0   Vitals BP: 137/84 Pulse Rate: 80 SpO2: 100 %   Anthropometric Measurements Height: 5' 10 (1.778 m) Weight: (!) 313 lb (142 kg) BMI (Calculated): 44.91 Weight at Last Visit: 313lb Weight Lost Since Last Visit: 0 Weight Gained Since Last Visit: 0 Starting Weight: 309lb Total Weight Loss (lbs): 0 lb (0 kg) Peak Weight: 410lb   Body Composition  Body Fat %: 51 % Fat Mass (lbs): 160 lbs Muscle Mass (lbs): 146 lbs Total Body Water (lbs): 106.4 lbs Visceral Fat Rating : 16   Other Clinical Data Fasting: no Labs: no Today's Visit #: 4 Starting Date: 11/20/23    OBESITY Jasmeet is here to discuss her progress with her obesity treatment plan along with follow-up of her obesity related diagnoses.    Nutrition Plan: the Category 3 plan - 80% adherence.  Current exercise: walking and gym  Interim History:  Her weight remains the same. According to the bio-impedence scale, her body fat remains the same, and muscle mass is up 0.2 lbs.  Eating all of the food on the plan., Protein intake is as prescribed, Is not skipping meals, and Water intake is adequate.   Pharmacotherapy: Lexine is on Zepbound  2.5 mg SQ weekly (start)  Hunger is moderately controlled.  Cravings are moderately controlled.  Assessment/Plan:   Vitamin D  Deficiency Vitamin D  is not at goal of 50.  Most recent vitamin D  level was 24.5. She is on  prescription ergocalciferol  50,000 IU weekly. Lab Results  Component Value Date   VD25OH 24.5 (L) 11/20/2023    Plan: Refill prescription vitamin D  50,000 IU weekly.   Insulin  Resistance Roneisha has had elevated fasting insulin  readings. Goal is HgbA1c < 5.7, fasting insulin  at l0 or less, and preferably at 5.  She reports denies  polyphagia. Medication(s): none Lab Results  Component Value Date   HGBA1C 5.3 11/20/2023   Lab Results  Component Value Date   INSULIN  18.2 11/20/2023    Plan Medication(s): Zepbound  2.5 mg SQ weekly Will work on the agreed upon plan. Will minimize refined carbohydrates ( sweets and starches), and focus more on complex carbohydrates.  Increase the micronutrients found in leafy greens, which include magnesium, polyphenols, and vitamin C which have been postulated to help with insulin  sensitivity. Minimize fast food and cook more meals at home.  Increase fiber to 25 to 30 grams daily.   Patient was counseled on the importance of maintaining healthy lifestyle habits, including balanced nutrition, regular physical activity, and behavioral modifications, while taking antiobesity medication.  Patient verbalized understanding that medication is an adjunct to, not a replacement for, lifestyle changes and that the long-term success and weight maintenance depend on continued adherence to these strategies.   Rubylee denies personal or family history of thyroid   cancer, history of pancreatitis, or current cholelithiasis. Tiffany was informed of the most common side effects (nausea, constipation, diarrhea). She was given GLP-1 information sheet.  Patient informed to watch for possible symptoms, such as a lump or swelling in the neck, hoarseness, trouble swallowing, or shortness of breath. If you have any of these symptoms, tell your healthcare provide.   She has been placed on a 500 calorie deficit diet.  She has been advised to exercise at least 150 minutes per week, both cardio and resistance.     Morbid Obesity: Current BMI BMI (Calculated): 44.91   Pharmacotherapy Plan Start  Zepbound  2.5 mg SQ weekly  Tyniah is currently in the action stage of change. As such, her goal is to continue with weight loss efforts.  She has agreed to the Category 3 plan.  Exercise goals: All adults should avoid  inactivity. Some physical activity is better than none, and adults who participate in any amount of physical activity gain some health benefits.  Behavioral modification strategies: increasing lean protein intake, decreasing simple carbohydrates , no meal skipping, meal planning , increase water intake, better snacking choices, planning for success, avoiding temptations, keep healthy foods in the home, and increase frequency of journaling.  Araiyah has agreed to follow-up with our clinic in 4 weeks.    Objective:   VITALS: Per patient if applicable, see vitals. GENERAL: Alert and in no acute distress. CARDIOPULMONARY: No increased WOB. Speaking in clear sentences.  PSYCH: Pleasant and cooperative. Speech normal rate and rhythm. Affect is appropriate. Insight and judgement are appropriate. Attention is focused, linear, and appropriate.  NEURO: Oriented as arrived to appointment on time with no prompting.   Attestation Statements:   This was prepared with the assistance of Engineer, Civil (consulting).  Occasional wrong-word or sound-a-like substitutions may have occurred due to the inherent limitations of voice recognition.   Clayborne Daring, DO

## 2024-01-17 ENCOUNTER — Other Ambulatory Visit (INDEPENDENT_AMBULATORY_CARE_PROVIDER_SITE_OTHER): Payer: Self-pay | Admitting: Bariatrics

## 2024-01-17 ENCOUNTER — Telehealth: Payer: Self-pay

## 2024-01-17 MED ORDER — WEGOVY 0.25 MG/0.5ML ~~LOC~~ SOAJ: 0.2500 mg | SUBCUTANEOUS | 0 refills | Status: DC

## 2024-01-17 NOTE — Telephone Encounter (Signed)
 Wegovy  approved from 01/17/2024 to 08/16/2024.

## 2024-01-17 NOTE — Telephone Encounter (Signed)
 Started PA for Wegovy  0.25mg 

## 2024-01-17 NOTE — Telephone Encounter (Signed)
 Notified patient that Dr. Delores will send over prescription for Wegovy  to preferred pharmacy. Patient verbalized understanding

## 2024-01-28 ENCOUNTER — Encounter: Payer: Self-pay | Admitting: Nurse Practitioner

## 2024-01-28 ENCOUNTER — Ambulatory Visit: Admitting: Nurse Practitioner

## 2024-01-28 VITALS — BP 139/86 | HR 71 | Temp 97.7°F | Ht 70.0 in | Wt 317.0 lb

## 2024-01-28 DIAGNOSIS — Z6841 Body Mass Index (BMI) 40.0 and over, adult: Secondary | ICD-10-CM

## 2024-01-28 DIAGNOSIS — E88819 Insulin resistance, unspecified: Secondary | ICD-10-CM

## 2024-01-28 DIAGNOSIS — E559 Vitamin D deficiency, unspecified: Secondary | ICD-10-CM

## 2024-01-28 DIAGNOSIS — E66813 Obesity, class 3: Secondary | ICD-10-CM | POA: Diagnosis not present

## 2024-01-28 MED ORDER — ZEPBOUND 2.5 MG/0.5ML ~~LOC~~ SOAJ: 2.5000 mg | SUBCUTANEOUS | 0 refills | Status: DC

## 2024-01-28 MED ORDER — VITAMIN D (ERGOCALCIFEROL) 1.25 MG (50000 UNIT) PO CAPS: 50000.0000 [IU] | ORAL_CAPSULE | ORAL | 0 refills | Status: AC

## 2024-01-28 NOTE — Progress Notes (Signed)
 "  Office: [phone]  /  Fax: [phone]  WEIGHT SUMMARY AND BIOMETRICS  Weight Lost Since Last Visit: 0lb  Weight Gained Since Last Visit: 4lb   Vitals Temp: 97.7 F (36.5 C) BP: 139/86 Pulse Rate: 71 SpO2: 100 %   Anthropometric Measurements Height: 5' 10 (1.778 m) Weight: (!) 317 lb (143.8 kg) BMI (Calculated): 45.48 Weight at Last Visit: 313lb Weight Lost Since Last Visit: 0lb Weight Gained Since Last Visit: 4lb Starting Weight: 309lb Total Weight Loss (lbs): 0 lb (0 kg) Peak Weight: 410lb   Body Composition  Body Fat %: 50.8 % Fat Mass (lbs): 161.4 lbs Muscle Mass (lbs): 148.6 lbs Total Body Water (lbs): 105.8 lbs Visceral Fat Rating : 16   Other Clinical Data Fasting: No Labs: No Today's Visit #: 5 Starting Date: 11/20/23     HPI  Chief Complaint: OBESITY  Ashley Miller is here to discuss her progress with her obesity treatment plan. She is on the the Category 2 Plan and states she is following her eating plan approximately 50 % of the time. She states she is exercising 30-60 minutes 5 days per week.   Interval History:  Since last office visit she has gained 4 pounds.  She is not currently taking medications  for medical weight loss.  She notes she got off track due to the holidays.  She as prescribed Wegovy  but hasn't been able to start yet.  Was just notified that it is ready to be picked up at the pharmacy.  She hurt her knee over the weekend and is limited on her exercising.  She has made a follow up appt with ortho for pain.   She wasn't able to start Zepbound  due to cost/coverage  Wegovy  approved from 01/17/2024 to 08/16/2024.    Previous pharmacotherapy for medical weight loss:  She started Wegovy  04/2021 and stopped taking it October 2024 prior to having surgery.  She was then restarted back on 2.4mg  after being off of it for 2 months.  She had side effects when restarted back at the higher dose.  She was not started back on the lower  dose.  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 Her highest weight prior to surgery was 406 lbs and her nadir weight was 270s.    Insulin  Resistance Last fasting insulin  was 18.2. A1c was 5.3. Polyphagia:Yes Medication(s): None-hasn't wanted to start Metformin  Lab Results  Component Value Date   HGBA1C 5.3 11/20/2023   Lab Results  Component Value Date   INSULIN  18.2 11/20/2023   Vit D deficiency  She is taking Vit D 50,000 IU weekly.  Denies side effects.  Denies nausea, vomiting or muscle weakness.    Lab Results  Component Value Date   VD25OH 24.5 (L) 11/20/2023     PHYSICAL EXAM:  Blood pressure 139/86, pulse 71, temperature 97.7 F (36.5 C), height 5' 10 (1.778 m), weight (!) 317 lb (143.8 kg), SpO2 100%. Body mass index is 45.48 kg/m.  General: She is overweight, cooperative, alert, well developed, and in no acute distress. PSYCH: Has normal mood, affect and thought process.   Extremities: No edema.  Neurologic: No gross sensory or motor deficits. No tremors or fasciculations noted.    DIAGNOSTIC DATA REVIEWED:  BMET    Component Value Date/Time   NA 135 11/20/2023 0833   K 4.2 11/20/2023 0833   CL 96 11/20/2023 0833   CO2 25 11/20/2023 0833   GLUCOSE 78 11/20/2023 0833   GLUCOSE 91  10/07/2019 1138   BUN 13 11/20/2023 0833   CREATININE 0.79 11/20/2023 0833   CREATININE 0.90 10/07/2019 1138   CALCIUM  9.3 11/20/2023 0833   GFRNONAA >60 10/07/2019 1138   GFRAA >60 10/07/2019 1138   Lab Results  Component Value Date   HGBA1C 5.3 11/20/2023   Lab Results  Component Value Date   INSULIN  18.2 11/20/2023   Lab Results  Component Value Date   TSH 1.890 11/20/2023   CBC    Component Value Date/Time   WBC 8.8 11/20/2023 0833   WBC 7.8 10/07/2019 1138   WBC 8.0 04/20/2017 0506   RBC 4.31 11/20/2023 0833   RBC 4.57 10/07/2019 1138   HGB 12.5 11/20/2023 0833   HCT 38.9 11/20/2023 0833   PLT 308 11/20/2023 0833   MCV 90  11/20/2023 0833   MCH 29.0 11/20/2023 0833   MCH 29.5 10/07/2019 1138   MCHC 32.1 11/20/2023 0833   MCHC 32.7 10/07/2019 1138   RDW 11.9 11/20/2023 0833   Iron Studies    Component Value Date/Time   IRON 53 08/24/2017 0840   TIBC 365 08/24/2017 0840   FERRITIN 26 08/24/2017 0840   IRONPCTSAT 15 (L) 08/24/2017 0840   Lipid Panel     Component Value Date/Time   CHOL 172 11/20/2023 0833   TRIG 72 11/20/2023 0833   HDL 61 11/20/2023 0833   LDLCALC 97 11/20/2023 0833   Hepatic Function Panel     Component Value Date/Time   PROT 7.3 11/20/2023 0833   ALBUMIN 4.1 11/20/2023 0833   AST 16 11/20/2023 0833   AST 12 (L) 10/07/2019 1138   ALT 15 11/20/2023 0833   ALT 10 10/07/2019 1138   ALKPHOS 93 11/20/2023 0833   BILITOT 0.6 11/20/2023 0833   BILITOT 0.8 10/07/2019 1138      Component Value Date/Time   TSH 1.890 11/20/2023 0833   Nutritional Lab Results  Component Value Date   VD25OH 24.5 (L) 11/20/2023     ASSESSMENT AND PLAN  TREATMENT PLAN FOR OBESITY:  Recommended Dietary Goals  Genasis is currently in the action stage of change. As such, her goal is to continue weight management plan. She has agreed to the Category 3 Plan.  Behavioral Intervention  We discussed the following Behavioral Modification Strategies today: increasing lean protein intake to established goals, decreasing simple carbohydrates , increasing vegetables, increasing fiber rich foods, increasing water intake , work on meal planning and preparation, reading food labels , keeping healthy foods at home, celebration eating strategies, continue to work on maintaining a reduced calorie state, getting the recommended amount of protein, incorporating whole foods, making healthy choices, staying well hydrated and practicing mindfulness when eating., and increase protein intake, fibrous foods (25 grams per day for women, 30 grams for men) and water to improve satiety and decrease hunger signals.  .  Additional resources provided today: NA  Recommended Physical Activity Goals  Rubye has been advised to work up to 150 minutes of moderate intensity aerobic activity a week and strengthening exercises 2-3 times per week for cardiovascular health, weight loss maintenance and preservation of muscle mass.   She has agreed to Think about enjoyable ways to increase daily physical activity and overcoming barriers to exercise, Increase physical activity in their day and reduce sedentary time (increase NEAT)., Increase volume of physical activity to a goal of 240 minutes a week, and Combine aerobic and strengthening exercises for efficiency and improved cardiometabolic health.   Pharmacotherapy We discussed various medication options  to help Chrys with her weight loss efforts and we both agreed to start Zepbound  2.5mg . Side effects discussed.  Do not start Wegovy .  She has an IUD.    ASSOCIATED CONDITIONS ADDRESSED TODAY  Action/Plan  Insulin  resistance Sammantha will continue to work on weight loss, exercise, and decreasing simple carbohydrates to help decrease the risk of diabetes. Rudene agreed to follow-up with us  as directed to closely monitor her progress.   Vitamin D  deficiency -     Vitamin D  (Ergocalciferol ); Take 1 capsule (50,000 Units total) by mouth every 7 (seven) days.  Dispense: 12 capsule; Refill: 0. Side effects discussed.    Obesity, Class III, BMI 40-49.9 (morbid obesity) (HCC) -     Zepbound ; Inject 2.5 mg into the skin once a week.  Dispense: 2 mL; Refill: 0         Return in about 4 weeks (around 02/25/2024).SABRA She was informed of the importance of frequent follow up visits to maximize her success with intensive lifestyle modifications for her multiple health conditions.   ATTESTASTION STATEMENTS:  Reviewed by clinician on day of visit: allergies, medications, problem list, medical history, surgical history, family history, social history, and previous encounter  notes.    Corean SAUNDERS. Yashas Camilli FNP-C "

## 2024-01-30 DIAGNOSIS — E559 Vitamin D deficiency, unspecified: Secondary | ICD-10-CM | POA: Diagnosis not present

## 2024-01-30 DIAGNOSIS — I119 Hypertensive heart disease without heart failure: Secondary | ICD-10-CM | POA: Diagnosis not present

## 2024-01-30 DIAGNOSIS — I10 Essential (primary) hypertension: Secondary | ICD-10-CM | POA: Diagnosis not present

## 2024-01-30 DIAGNOSIS — K5909 Other constipation: Secondary | ICD-10-CM | POA: Diagnosis not present

## 2024-02-18 ENCOUNTER — Encounter: Payer: Self-pay | Admitting: Nurse Practitioner

## 2024-02-18 ENCOUNTER — Ambulatory Visit: Admitting: Nurse Practitioner

## 2024-02-18 VITALS — BP 134/85 | HR 78 | Temp 98.4°F | Ht 70.0 in | Wt 322.0 lb

## 2024-02-18 DIAGNOSIS — E559 Vitamin D deficiency, unspecified: Secondary | ICD-10-CM

## 2024-02-18 DIAGNOSIS — E66813 Obesity, class 3: Secondary | ICD-10-CM

## 2024-02-18 DIAGNOSIS — Z6841 Body Mass Index (BMI) 40.0 and over, adult: Secondary | ICD-10-CM | POA: Diagnosis not present

## 2024-02-18 MED ORDER — WEGOVY 0.25 MG/0.5ML ~~LOC~~ SOAJ: 0.2500 mg | SUBCUTANEOUS | 0 refills | Status: AC

## 2024-02-18 NOTE — Patient Instructions (Signed)
 " What is a GLP-1 Glucagon like peptide-1 (GLP-1) agonists represent a class of medications used to treat type 2 diabetes mellitus and obesity.  GLP-1 medications mimic the action of a hormone called glucagon like peptide 1.  When blood sugar levels start to rise/increase these drugs stimulate the body to produce more insulin .  When that happens, the extra insulin  helps to lower the blood sugar levels in the body.  This in returns helps with decreasing cravings.  These medications also slow the movement of food from the stomach into the small intestine.  This in return helps one to full faster and longer.   Diabetic medications: Approved for treatment of diabetes mellitus but does not have full approval for weight loss use Victoza (liraglutide) Ozempic (semaglutide ) Steps to starting your Wegovy   The office staff will send a prior authorization request to your insurance company for approval. We will send you a mychart message once we hear back from your insurance with a decision.  This can take up to 7-10 business days.   Once your Wegovyis approved, you may then pick up Wegovy  pen from your pharmacy.    Learn how to do Wegovy  injections on the Wegovy .com website. There is a training video that will walk you through how to safely perform the injection. If you have questions for our clinical staff, please contact our  clinical staff. If you have any symptoms of allergic reaction to Wegovy  discontinue immediately and call 911.  1. What should I tell my provider before using Wegovy  ? have or have had problems with your pancreas or kidneys. have type 2 diabetes and a history of diabetic retinopathy. have or have had depression, suicidal thoughts, or mental health issues. are pregnant or plan to become pregnant. Wegovy  may harm your unborn baby. You should stop using Wegovy  3 months before you plan to become pregnant or if you are breastfeeding or plan to breastfeed. It is not known if Wegovy   passes into your breast milk.  2. What is Wegovy  and how does it work?  Wegovy  is an injectable prescription medication prescribed by your provider to help with your weight loss.  This medicine will be most effective when combined with a reduced calorie diet and physical activity.  Wegovy  is not for the treatment of type 2 diabetes mellitus. Wegovy  should not be used with other GLP-1 receptor agonist medicines. The addition of Wegovy  in  patients treated with insulin  has not been evaluated. When initiating Wegovy , consider reducing the dose of concomitantly administered insulin  secretagogues (such as sulfonylureas) or insulin  to reduce the risk of  hypoglycemia.  One role of GLP-1 is to send a signal to your brain to tell it you are full. It also slows down stomach emptying which will make you feel full longer and may help with reducing cravings.   3.  How should I take Wegovy ?  Administer Wegovy  once weekly, on the same day each week, at any time of day, with or without meals Inject subcutaneously in the abdomen, thigh or upper arm Initiate at 0.25 mg once weekly for 4 weeks. In 4 week intervals, increase the dose until a dose of 2.4 mg is reached (we will discuss with you the dosage at each visit). The maintenance dose of Wegovy  is 2.4 mg once weekly.  The dosing schedule of Wegovy  is:  0.25 mg per week X 4 weeks 0.5 mg per week X 4 weeks 1.0 mg per week X 4 weeks 1.7 mg per week  X 4 weeks 2.4 mg per week   Missed dose   If you miss your injection day, go ahead inject your current dose. You can go >7 days, but not <7 days between injections. You may change your injection day (It must be >7 days). If you miss >2 doses, you can still keep next injection dose the same or follow de-escalation schedule which may minimize GI symptoms.   In patients with type 2 diabetes, monitor blood glucose prior to starting and during WEGOVY  treatment.   Inject your dose of Wegovy  under the  skin (subcutaneous injection) in your stomach area (abdomen), upper leg (thigh) or upper arm. Do not inject into a vein or a muscle. The injection site should be rotated and not given in the same spot each day. Hold the needle under the skin and count to 10. This will allow all of the medicine to be dispensed under the skin. Always wipe your skin with an alcohol prep pad before injection  Dispose of used pen in an approved sharps container. More practical options that can be put in the trash  to go to the landfill are milk jugs or plastic laundry detergent containers with a screw on lid.  What side effects may I notice from taking Wegovy ?  Side effects that usually do not require medical attention (report to our office if they continue or are bothersome): Nausea (most common but decreases over time in most people as their body gets used to the medicine) Diarrhea Constipation (you may take an over the counter laxative if needed) Headache Decreased appetite Upset stomach Tiredness Dizziness Feeling bloated Hair loss Belching Gas Heartburn  Side effects that you should call 911 as soon as possible Vomiting Stomach pain Fever Yellowing of your skin or eyes  Clay-colored stools Increased heart rate while at rest Low blood sugar  Sudden changes in mood, behaviors, thoughts, feelings, or thoughts of suicide If you get a lump or swelling in your neck, hoarseness, trouble swallowing, or shortness of breath. Allergic reaction such as skin rash, itching, hives, swelling of the face, tongue, or lips  Helpful tips for managing nausea Nausea is a common side effect when first starting Wegovy . If you experience nausea, be sure to connect with your health care provider. He or she will offer guidance on ways to manage it, which may include: Eat bland, low-fat foods, like crackers, toast and rice  Eat foods that contain water, like soups and gelatin  Avoid lying down after you eat  Go  outdoors for fresh air  Eat more slowly    Other important information Do not drop your pen or knock it against hard surfaces  Do not expose your pen to any liquids  If you think that your pen may be damaged, do not try to fix it. Use a new one Keep the pen cap on until you are ready to inject. Your pen will no longer be sterile if you store an unused pen without the cap, if you pull the pen cap off and put it on again, or if the pen cap is missing. This could lead to an infection  Store the Wegovy  pen in the refrigerator from 6F to 66F (2C to 8C) If needed, before removing the pen cap, Wegovy  can be stored from 8C to 30C (66F to 52F) in the original carton for up to 28 days.  Keep Wegovy  in the original carton to protect it from light  Do not freeze  Throw away  pen if Wegovy  has been frozen, has been exposed to light or temperatures above 30F (30C), or has been out of the refrigerator for 28 days or longer It's important to properly dispose of your used Wegovy  pens. Do not throw the pen away in your household trash. Instead, use an FDA-cleared sharps disposable container or a sturdy household container with a tight-fitting lid, like a heavy duty plastic container.   Wegovy  pen training website: https://www.wegovy .com/about-wegovy /how-to-use-the-wegovy -pen.html  Wegovy  savings and support link: https://www.wegovy .com/saving-and-support/save-and-support.html    Trulicity Rybelsus  Weight loss medications: Approved for long-term weight loss use.        Saxenda (liraglutide) Wegovy  (semaglutide ) Zepbound  Contraindications:  Pancreatitis (active gallstones) Medullary thyroid  cancer High triglycerides (>500)-will need labs prior to starting Multiple Endocrine Neoplasia syndrome type 2 (MEN 2) Trying to get pregnant Breastfeeding Use with caution with taking insulin  or sulfonylureas (will need to monitor blood sugars for hypoglycemia) Side effects (most common): Most  common side effects are nausea, gas, bloating and constipation.  Other possible side effects are headaches, belching, diarrhea, tiredness (fatigue), vomiting, upset stomach, dizziness, heartburn and stomach (abdominal pain).  If you think that you are becoming dehydrated, please inform our office or your primary family provider.  Stop immediately and go to ER if you have any symptoms of a serious allergic reaction including swelling of your face, lips, tongue or throat; problems breathing or swallowing; severe rash or itching; fainting or feeling dizzy; or very rapid heart rate.                                                                                          "

## 2024-02-18 NOTE — Progress Notes (Signed)
 "  Office: [phone]  /  Fax: [phone]  WEIGHT SUMMARY AND BIOMETRICS  Weight Lost Since Last Visit: 0lb  Weight Gained Since Last Visit: 5lb   Vitals Temp: 98.4 F (36.9 C) BP: 134/85 Pulse Rate: 78 SpO2: 100 %   Anthropometric Measurements Height: 5' 10 (1.778 m) Weight: (!) 322 lb (146.1 kg) BMI (Calculated): 46.2 Weight at Last Visit: 317lb Weight Lost Since Last Visit: 0lb Weight Gained Since Last Visit: 5lb Starting Weight: 309lb Total Weight Loss (lbs): 0 lb (0 kg)   Body Composition  Body Fat %: 50.8 % Fat Mass (lbs): 163.6 lbs Muscle Mass (lbs): 150.4 lbs Total Body Water (lbs): 103.4 lbs Visceral Fat Rating : 17   Other Clinical Data Fasting: Yes Labs: No Today's Visit #: 6 Starting Date: 11/20/23     HPI  Chief Complaint: OBESITY  Ashley Miller is here to discuss her progress with her obesity treatment plan. She is on the the Category 2 Plan and states she is following her eating plan approximately 50 % of the time. She states she is exercising 15-25 minutes 7 days per week.   Interval History:  Since last office visit she has gained 5 pounds.  She is skipping breakfast.  She is drinking coffee from [employer] for breakfast.  For lunch, she is eating chicken, cabbage, snacks simply chip snacks and cheese/crackers and dinner collards, beef ribs, mac and cheese.  She is drinking [employer] coffee, water not enough, occ ginger ale and cranberry juice.  She struggles with knee pain.  She saw ortho and had an injection in her right knee.  She is walking to stay active. Plans to start swimming.    No upcoming celebrations or vacations    Pharmacotherapy for weight loss: She is not currently taking medications  for medical weight loss.    She wasn't able to start Zepbound  due to cost.   Previous pharmacotherapy for medical weight loss:  GLP-1 and Phentermine (Wegovy -nausea and Phentermine-raised BP)   Bariatric surgery:  She is s/p laparoscopic  sleeve gastrectomy. Her highest weight prior to surgery was 406 lbs and her nadir weight was 270s.    Vit D deficiency  She is taking Vit D OTC, not weekly dose.  Denies side effects.  Denies nausea, vomiting or muscle weakness.    Lab Results  Component Value Date   VD25OH 24.5 (L) 11/20/2023      PHYSICAL EXAM:  Blood pressure 134/85, pulse 78, temperature 98.4 F (36.9 C), height 5' 10 (1.778 m), weight (!) 322 lb (146.1 kg), SpO2 100%. Body mass index is 46.2 kg/m.  General: She is overweight, cooperative, alert, well developed, and in no acute distress. PSYCH: Has normal mood, affect and thought process.   Extremities: No edema.  Neurologic: No gross sensory or motor deficits. No tremors or fasciculations noted.    DIAGNOSTIC DATA REVIEWED:  BMET    Component Value Date/Time   NA 135 11/20/2023 0833   K 4.2 11/20/2023 0833   CL 96 11/20/2023 0833   CO2 25 11/20/2023 0833   GLUCOSE 78 11/20/2023 0833   GLUCOSE 91 10/07/2019 1138   BUN 13 11/20/2023 0833   CREATININE 0.79 11/20/2023 0833   CREATININE 0.90 10/07/2019 1138   CALCIUM  9.3 11/20/2023 0833   GFRNONAA >60 10/07/2019 1138   GFRAA >60 10/07/2019 1138   Lab Results  Component Value Date   HGBA1C 5.3 11/20/2023   Lab Results  Component Value Date   INSULIN  18.2 11/20/2023  Lab Results  Component Value Date   TSH 1.890 11/20/2023   CBC    Component Value Date/Time   WBC 8.8 11/20/2023 0833   WBC 7.8 10/07/2019 1138   WBC 8.0 04/20/2017 0506   RBC 4.31 11/20/2023 0833   RBC 4.57 10/07/2019 1138   HGB 12.5 11/20/2023 0833   HCT 38.9 11/20/2023 0833   PLT 308 11/20/2023 0833   MCV 90 11/20/2023 0833   MCH 29.0 11/20/2023 0833   MCH 29.5 10/07/2019 1138   MCHC 32.1 11/20/2023 0833   MCHC 32.7 10/07/2019 1138   RDW 11.9 11/20/2023 0833   Iron Studies    Component Value Date/Time   IRON 53 08/24/2017 0840   TIBC 365 08/24/2017 0840   FERRITIN 26 08/24/2017 0840   IRONPCTSAT 15 (L)  08/24/2017 0840   Lipid Panel     Component Value Date/Time   CHOL 172 11/20/2023 0833   TRIG 72 11/20/2023 0833   HDL 61 11/20/2023 0833   LDLCALC 97 11/20/2023 0833   Hepatic Function Panel     Component Value Date/Time   PROT 7.3 11/20/2023 0833   ALBUMIN 4.1 11/20/2023 0833   AST 16 11/20/2023 0833   AST 12 (L) 10/07/2019 1138   ALT 15 11/20/2023 0833   ALT 10 10/07/2019 1138   ALKPHOS 93 11/20/2023 0833   BILITOT 0.6 11/20/2023 0833   BILITOT 0.8 10/07/2019 1138      Component Value Date/Time   TSH 1.890 11/20/2023 0833   Nutritional Lab Results  Component Value Date   VD25OH 24.5 (L) 11/20/2023     ASSESSMENT AND PLAN  TREATMENT PLAN FOR OBESITY:  Recommended Dietary Goals  Ashley Miller is currently in the action stage of change. As such, her goal is to continue weight management plan. She has agreed to track and will review macros at next visit.  She is consuming too many carbs and sugar, not enough protein.  Needs to stop sugary drinks.    Behavioral Intervention  We discussed the following Behavioral Modification Strategies today: increasing lean protein intake to established goals, decreasing simple carbohydrates , increasing vegetables, increasing fiber rich foods, increasing water intake , work on meal planning and preparation, work on tracking and journaling calories using tracking application, reading food labels , continue to work on maintaining a reduced calorie state, getting the recommended amount of protein, incorporating whole foods, making healthy choices, staying well hydrated and practicing mindfulness when eating., and increase protein intake, fibrous foods (25 grams per day for women, 30 grams for men) and water to improve satiety and decrease hunger signals. .  Additional resources provided today: NA  Recommended Physical Activity Goals  Ashley Miller has been advised to work up to 150 minutes of moderate intensity aerobic activity a week and  strengthening exercises 2-3 times per week for cardiovascular health, weight loss maintenance and preservation of muscle mass.   She has agreed to Think about enjoyable ways to increase daily physical activity and overcoming barriers to exercise, Increase physical activity in their day and reduce sedentary time (increase NEAT)., Work on scheduling and tracking physical activity. , Continue to gradually increase the amount and intensity of exercise routine, Increase volume of physical activity to a goal of 240 minutes a week, and Combine aerobic and strengthening exercises for efficiency and improved cardiometabolic health.   Pharmacotherapy We discussed various medication options to help Ashley Miller with her weight loss efforts and we both agreed to start Wegovy  0.25 mg. Side effects discussed.  Patient  has an IUD.    Contraindications:  Pancreatitis (active gallstones) Medullary thyroid  cancer High triglycerides (>500)-will need labs prior to starting Multiple Endocrine Neoplasia syndrome type 2 (MEN 2) Trying to get pregnant Breastfeeding Use with caution with taking insulin  or sulfonylureas (will need to monitor blood sugars for hypoglycemia)   ASSOCIATED CONDITIONS ADDRESSED TODAY  Action/Plan  Vitamin D  deficiency Patient would like to continue daily Vit D.    Obesity, Class III, BMI 40-49.9 (morbid obesity) (HCC) -     Wegovy ; Inject 0.25 mg into the skin once a week.  Dispense: 2 mL; Refill: 0.  Side effects discussed.       Will obtain labs in March or April    Return in about 4 weeks (around 03/17/2024).SABRA She was informed of the importance of frequent follow up visits to maximize her success with intensive lifestyle modifications for her multiple health conditions.   ATTESTASTION STATEMENTS:  Reviewed by clinician on day of visit: allergies, medications, problem list, medical history, surgical history, family history, social history, and previous encounter notes.      Ashley Miller SAUNDERS. Alessander Sikorski FNP-C "

## 2024-03-18 ENCOUNTER — Ambulatory Visit: Admitting: Nurse Practitioner
